# Patient Record
Sex: Female | Born: 1960 | Race: Black or African American | Hispanic: No | Marital: Married | Smoking: Never smoker
Health system: Southern US, Community
[De-identification: ages and names within clinical notes are randomized; demographics above are authoritative.]

## PROBLEM LIST (undated history)

## (undated) DIAGNOSIS — D57212 Sickle-cell/Hb-C disease with splenic sequestration: Secondary | ICD-10-CM

## (undated) DIAGNOSIS — H4310 Vitreous hemorrhage, unspecified eye: Secondary | ICD-10-CM

## (undated) DIAGNOSIS — D509 Iron deficiency anemia, unspecified: Secondary | ICD-10-CM

## (undated) DIAGNOSIS — E119 Type 2 diabetes mellitus without complications: Secondary | ICD-10-CM

## (undated) DIAGNOSIS — D696 Thrombocytopenia, unspecified: Secondary | ICD-10-CM

## (undated) DIAGNOSIS — N491 Inflammatory disorders of spermatic cord, tunica vaginalis and vas deferens: Secondary | ICD-10-CM

## (undated) DIAGNOSIS — I10 Essential (primary) hypertension: Secondary | ICD-10-CM

## (undated) DIAGNOSIS — D5720 Sickle-cell/Hb-C disease without crisis: Secondary | ICD-10-CM

## (undated) HISTORY — DX: Inflammatory disorders of spermatic cord, tunica vaginalis and vas deferens: N49.1

## (undated) HISTORY — DX: Iron deficiency anemia, unspecified: D50.9

## (undated) HISTORY — DX: Type 2 diabetes mellitus without complications: E11.9

## (undated) HISTORY — DX: Thrombocytopenia, unspecified: D69.6

## (undated) HISTORY — DX: Sickle-cell/Hb-C disease without crisis: D57.20

## (undated) HISTORY — DX: Sickle-cell/Hb-C disease with splenic sequestration: D57.212

## (undated) HISTORY — DX: Essential (primary) hypertension: I10

## (undated) HISTORY — DX: Vitreous hemorrhage, unspecified eye: H43.10

---

## 2011-07-06 ENCOUNTER — Telehealth: Payer: Self-pay | Admitting: Oncology

## 2011-07-06 ENCOUNTER — Telehealth: Payer: Self-pay | Admitting: *Deleted

## 2011-07-06 NOTE — Telephone Encounter (Signed)
called pts home and the number was not in service.  called george Larzelere lmomv that MD was avail 04/17and to rtn call to confirm appt

## 2011-07-06 NOTE — Telephone Encounter (Signed)
Rec'd referral from [facility] for new outpatient appt to be established with Dr Cyndie Chime for Sickle Cell with anemia. No records of any previous consult noted in EPIC or Echart. Called and left message for patient to call me back, maybe with a different name? Patient phone [phone], emergency contact sister, Indya Oliveria, [phone]. Proceed with orders to establish appt.

## 2011-07-06 NOTE — Telephone Encounter (Signed)
pt rtn call and is coming for appt on 04/17.  will fax a letter to [facility] with apt d/t

## 2011-07-07 ENCOUNTER — Telehealth: Payer: Self-pay | Admitting: Oncology

## 2011-07-07 NOTE — Telephone Encounter (Signed)
Referred by Dr. Gean Birchwood [place] Case Dx- Sickle Cell Dx

## 2011-07-15 ENCOUNTER — Encounter: Payer: Self-pay | Admitting: Oncology

## 2011-07-15 ENCOUNTER — Ambulatory Visit: Payer: Self-pay

## 2011-07-15 ENCOUNTER — Telehealth: Payer: Self-pay | Admitting: *Deleted

## 2011-07-15 ENCOUNTER — Ambulatory Visit: Payer: Self-pay | Admitting: Oncology

## 2011-07-15 ENCOUNTER — Other Ambulatory Visit: Payer: Self-pay

## 2011-07-15 NOTE — Progress Notes (Signed)
The patient failed to report for her new patient visit here today. My nurse called her and she didn't remember that she had an appointment here today despite a number of caused by our new appointment nurse. She will be rescheduled.

## 2011-07-15 NOTE — Telephone Encounter (Signed)
Pt did not show for appt this am.  Called ph # from Laroy Apple RN note & she was getting ready to go to work & will need to reschedule.  She knew about appt but didn't think about it being the 17th today.  Amy Clovis Riley RN notified to r/s.

## 2011-07-16 ENCOUNTER — Telehealth: Payer: Self-pay | Admitting: *Deleted

## 2011-07-16 NOTE — Telephone Encounter (Signed)
Patient was a no show for New Patient Appt on 07/15/11, notified Renaldo Harrison, [phone],  that patient did not show so they can continue to monitor labs if needed until patient is established with our practice. Orders sent to r/s appt to a later date.

## 2011-07-22 ENCOUNTER — Telehealth: Payer: Self-pay | Admitting: Oncology

## 2011-07-22 NOTE — Telephone Encounter (Signed)
Called pt and left message regarding appt , waiting for a call back

## 2011-07-23 ENCOUNTER — Telehealth: Payer: Self-pay | Admitting: Oncology

## 2011-07-23 NOTE — Telephone Encounter (Signed)
Talked to pt, she is aware of appt and location of the appt

## 2011-08-04 ENCOUNTER — Encounter: Payer: Self-pay | Admitting: Oncology

## 2011-08-04 ENCOUNTER — Other Ambulatory Visit: Payer: Self-pay | Admitting: Oncology

## 2011-08-04 DIAGNOSIS — H4310 Vitreous hemorrhage, unspecified eye: Secondary | ICD-10-CM

## 2011-08-04 DIAGNOSIS — D509 Iron deficiency anemia, unspecified: Secondary | ICD-10-CM

## 2011-08-04 DIAGNOSIS — D57212 Sickle-cell/Hb-C disease with splenic sequestration: Secondary | ICD-10-CM | POA: Insufficient documentation

## 2011-08-04 DIAGNOSIS — I10 Essential (primary) hypertension: Secondary | ICD-10-CM

## 2011-08-04 DIAGNOSIS — E119 Type 2 diabetes mellitus without complications: Secondary | ICD-10-CM | POA: Insufficient documentation

## 2011-08-04 DIAGNOSIS — D5720 Sickle-cell/Hb-C disease without crisis: Secondary | ICD-10-CM | POA: Insufficient documentation

## 2011-08-04 DIAGNOSIS — D696 Thrombocytopenia, unspecified: Secondary | ICD-10-CM

## 2011-08-04 HISTORY — DX: Type 2 diabetes mellitus without complications: E11.9

## 2011-08-04 HISTORY — DX: Essential (primary) hypertension: I10

## 2011-08-04 HISTORY — DX: Sickle-cell/Hb-C disease with splenic sequestration: D57.212

## 2011-08-04 HISTORY — DX: Thrombocytopenia, unspecified: D69.6

## 2011-08-04 HISTORY — DX: Iron deficiency anemia, unspecified: D50.9

## 2011-08-04 HISTORY — DX: Sickle-cell/Hb-C disease without crisis: D57.20

## 2011-08-04 HISTORY — DX: Vitreous hemorrhage, unspecified eye: H43.10

## 2011-08-05 ENCOUNTER — Telehealth: Payer: Self-pay | Admitting: Oncology

## 2011-08-05 ENCOUNTER — Ambulatory Visit: Payer: Managed Care, Other (non HMO)

## 2011-08-05 ENCOUNTER — Encounter: Payer: Self-pay | Admitting: Oncology

## 2011-08-05 ENCOUNTER — Ambulatory Visit (HOSPITAL_BASED_OUTPATIENT_CLINIC_OR_DEPARTMENT_OTHER): Payer: Managed Care, Other (non HMO) | Admitting: Oncology

## 2011-08-05 ENCOUNTER — Other Ambulatory Visit (HOSPITAL_BASED_OUTPATIENT_CLINIC_OR_DEPARTMENT_OTHER): Payer: Managed Care, Other (non HMO) | Admitting: Lab

## 2011-08-05 VITALS — BP 136/76 | HR 102 | Temp 97.1°F | Ht 64.0 in | Wt 193.7 lb

## 2011-08-05 DIAGNOSIS — D509 Iron deficiency anemia, unspecified: Secondary | ICD-10-CM

## 2011-08-05 DIAGNOSIS — I10 Essential (primary) hypertension: Secondary | ICD-10-CM

## 2011-08-05 DIAGNOSIS — D5720 Sickle-cell/Hb-C disease without crisis: Secondary | ICD-10-CM

## 2011-08-05 DIAGNOSIS — E119 Type 2 diabetes mellitus without complications: Secondary | ICD-10-CM

## 2011-08-05 DIAGNOSIS — R161 Splenomegaly, not elsewhere classified: Secondary | ICD-10-CM

## 2011-08-05 DIAGNOSIS — D57212 Sickle-cell/Hb-C disease with splenic sequestration: Secondary | ICD-10-CM

## 2011-08-05 DIAGNOSIS — D696 Thrombocytopenia, unspecified: Secondary | ICD-10-CM

## 2011-08-05 LAB — CBC & DIFF AND RETIC
BASO%: 0.7 % (ref 0.0–2.0)
Immature Retic Fract: 34.7 % — ABNORMAL HIGH (ref 1.60–10.00)
MCHC: 33.4 g/dL (ref 31.5–36.0)
MONO#: 0.4 10*3/uL (ref 0.1–0.9)
RBC: 3.97 10*6/uL (ref 3.70–5.45)
Retic %: 8.42 % — ABNORMAL HIGH (ref 0.70–2.10)
WBC: 5.7 10*3/uL (ref 3.9–10.3)
lymph#: 1.3 10*3/uL (ref 0.9–3.3)

## 2011-08-05 LAB — MORPHOLOGY

## 2011-08-05 LAB — CHCC SMEAR

## 2011-08-05 NOTE — Progress Notes (Signed)
Patient came in today as a new patient she has [employer].I did explain to her the co-pay assistance program we offer here for medication and chemo drugs.I also gave her my card just in case she has any question she can call me.

## 2011-08-05 NOTE — Progress Notes (Signed)
New Patient Hematology-Oncology Evaluation   Monique Dominguez [id number] [birth year] 51 y.o. 08/05/2011  CC: Dr. Chestine Spore; Dr. Earl Lagos [facility] in [place]; Dr. Doristine Section orthopedic surgery   Reason for referral: Resume management of this lady with hemoglobin [place] disease   HPI: 51 year old woman with known [place] disease who I cared for about 20 years ago. Old records are in a warehouse and not available at time of this dictation.  She has known splenomegaly consistent with [place] disease. Overall she has had an indolent course until more recently. She reminded me in the past when  she got in trouble I told her that she had a splenic sequestration syndrome. She moved to [facility] and did not have any hospitalizations for many years. She returned to [place] in 2007. In 2010 a few weeks after surgery for a detached left retina she developed back pain and shortness of breath and on further evaluation was found to be in crisis with a hemoglobin of 5 and admitted to [facility] where she required blood transfusions. She did well again until recently. She developed another episode of back pain in March of 2013. The pain was unrelated to activity and actually woke her up from sleep. She sought orthopedic surgical consultation with Dr. Doristine Section. MRI was done. There were some degenerative changes but overall she was told the study was inconclusive. She was given some pain medication and started on physical therapy. In April of this year she again developed episodic severe low back pain radiating down the back so both of her legs and then after a few weeks she developed progressive dyspnea. She was admitted to [facility] in [place] again on April 2. They were kind enough to send me records. She had a rapid fall in her hemoglobin from 9.5 down to 6.6 over a 24-hour interval. She received a blood transfusion. Other than a chest  radiograph which was normal except for atelectasis, no other diagnostic imaging was done. Reticulocyte count was 8.5%. Bilirubin 2.9. Platelet count as low as 54,000. Note states that a haptoglobin was low and LDH was high (values not recorded in the records that I received). A parvovirus level was checked -  result not available.  Old records were reviewed and confirmed hemoglobin [place] with hemoglobin A 32.4%, hemoglobin C. 36.3%, and hemoglobin S 38.3% done on 11/07/2009. Hospital course was uncomplicated and she was discharged on April 4. She believes her hemoglobin was 11 at discharge. Other than noted above, she has had no other interim medical problems. She is now established with Dr. Margaretmary Bayley for her primary care. She was told she is mildly hypothyroid and put on Synthroid which she stopped on her own. She is supposed to be taking a B complex vitamin but she tells me she rarely takes it. She has known hypertension and type 2 diabetes on oral agent. 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 Ashley Royalty. She denies any history of hepatitis, or HIV.  PMH: Past Medical History  Diagnosis Date  . 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 Benign essential HTN 08/04/2011  . DM type 2 (diabetes mellitus, type 2) 08/04/2011  . Thrombocytopenia 08/04/2011  . Iron deficiency anemia 08/04/2011  . 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 Vitreous hemorrhage 08/04/2011    Prior surgery: Cholecystectomy 1993.  Health maintenance: She has not yet had a colonoscopy. She is menopausal  Allergies: No Known Allergies; she gets itching with codeine preparations  Medications: Amaryl 4 mg twice a day; Cozaar 25 mg daily; January metastases and 50/500 one daily. She is encouraged to go back on her Synthroid and her B complex vitamins particularly folic acid.   Social History:  She is single. No children. 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 She has 2 dogs. She is a nonsmoker. No alcohol use.  Family History:  Review of Systems: Constitutional symptoms: Currently no constitutional symptoms HEENT: No sore throat Respiratory: No cough or dyspnea Cardiovascular:  No chest pain or palpitations Gastrointestinal ROS: No change in bowel habit Genito-Urinary ROS: No vaginal bleeding no urinary tract symptoms  Hematological and Lymphatic: Musculoskeletal: Episodic low back pain no other areas of pain Neurologic: No headache, no change in vision, no paresthesias Dermatologic: No rash Remaining ROS negative.  Physical Exam: Blood pressure 136/76, pulse 102, temperature 97.1 F (36.2 C), temperature source Oral, height 5\' 4"  (1.626 m), weight 193 lb 11.2 oz (87.862 kg). Wt Readings from Last 3 Encounters:  08/05/11 193 lb 11.2 oz (87.862 kg)    General appearance: Pleasant well-nourished African American woman Head: Normal Neck: Prominent thyroid gland but no nodules Lymph nodes: No adenopathy Breasts: Not examined Lungs: Clear to auscultation resonant to percussion Heart: Regular rhythm no murmur Abdominal: Soft obese nontender spleen measures 15 cm GU: Not examined Extremities: No edema no calf tenderness Neurologic: Mental status intact, cranial nerves intact, pupils equal round reactive to light, optic atrophy and abnormal fundus on the left due to previous retinal detachment. I was unable to get a clear look at the right fundus Skin: No rash or ecchymosis    Lab Results: Lab Results  Component Value Date   WBC 5.7 08/05/2011   HGB 10.0* 08/05/2011   HCT 29.9* 08/05/2011   MCV 75.3* 08/05/2011   PLT 80* 08/05/2011     Chemistry   No results found for this basename: NA, K, CL, CO2, BUN, CREATININE, GLU   No results found for this basename: CALCIUM, ALKPHOS, AST, ALT, BILITOT       Review of peripheral blood film: Pending    Impression and Plan: #1. Sickle [place] disease. Overall she has had indolent  disease. However I do think that her episodic back pain may actually be related to episodes of acute splenic infarction since they always result in accelerated hemolysis. I think we should get a CT scan of her abdomen the next time she has abdominal or back pain to look for splenic infarcts. In the past I advised her against splenectomy but if she keeps getting recurrent episodes of splenic sequestration/accelerated hemolysis, splenectomy may be good palliation.  #2. "Iron deficiency anemia" I doubt this very much! 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 People who are hemolyzing don't get iron deficiency anemia unless there are also bleeding. All of the iron from the hemolyzed red cells goes back to the bone marrow. This is a closed cycle in humans.  #3. Essential hypertension  #4. Type 2 diabetes on oral agents  #5. History of left retinal detachment  #6. 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 History of cholecystectomy      Levert Feinstein, MD 08/05/2011, 11:16 AM

## 2011-08-05 NOTE — Telephone Encounter (Signed)
gve the pt her sept 2013 appt calendar °

## 2011-08-06 LAB — IRON AND TIBC
%SAT: 30 % (ref 20–55)
TIBC: 305 ug/dL (ref 250–470)

## 2011-08-06 LAB — COMPREHENSIVE METABOLIC PANEL
ALT: 14 U/L (ref 0–35)
AST: 20 U/L (ref 0–37)
Alkaline Phosphatase: 57 U/L (ref 39–117)
Creatinine, Ser: 1.07 mg/dL (ref 0.50–1.10)
Total Bilirubin: 2.5 mg/dL — ABNORMAL HIGH (ref 0.3–1.2)

## 2011-08-06 LAB — HEPATITIS PANEL, ACUTE
Hep B C IgM: NEGATIVE
Hepatitis B Surface Ag: NEGATIVE

## 2011-08-27 ENCOUNTER — Telehealth: Payer: Self-pay

## 2011-08-27 NOTE — Telephone Encounter (Signed)
Message copied by Albertha Ghee on Thu Aug 27, 2011 12:31 PM ------      Message from: Levert Feinstein      Created: Wed Aug 26, 2011  7:53 PM       Call pt  - she tests negative for hepatitis A,B,C,HIV  Route cc lab to primary care

## 2011-08-27 NOTE — Telephone Encounter (Signed)
Attempted to contact pt with results.  Received recording stating "the person you are trying to reach is not accepting calls at this time." Will continue to attempt.   Labs routed to PCP. dph

## 2011-09-04 ENCOUNTER — Telehealth: Payer: Self-pay | Admitting: *Deleted

## 2011-09-04 NOTE — Telephone Encounter (Signed)
Message copied by Sabino Snipes on Fri Sep 04, 2011  5:50 PM ------      Message from: Levert Feinstein      Created: Wed Aug 26, 2011  7:53 PM       Call pt  - she tests negative for hepatitis A,B,C,HIV  Route cc lab to primary care

## 2011-09-04 NOTE — Telephone Encounter (Signed)
Message copied by Sabino Snipes on Fri Sep 04, 2011  6:27 PM ------      Message from: Levert Feinstein      Created: Wed Aug 26, 2011  7:53 PM       Call pt  - she tests negative for hepatitis A,B,C,HIV  Route cc lab to primary care

## 2011-09-04 NOTE — Telephone Encounter (Signed)
Reached pt's identified vm @ [phone] & left message per Dr Patsy Lager request.  Results will be routed to PCP.

## 2011-09-04 NOTE — Telephone Encounter (Signed)
Tried yest & today to reach pt & phone message says she is not accepting calls at this time.

## 2011-09-07 ENCOUNTER — Telehealth: Payer: Self-pay

## 2011-09-07 NOTE — Telephone Encounter (Signed)
Called and left pt message to call office back.  Per note left from previous desk RN, need to find out who pt's PCP is, so labs from 08/05/11 can be routed to him or her.

## 2011-09-08 ENCOUNTER — Telehealth: Payer: Self-pay | Admitting: *Deleted

## 2011-09-08 NOTE — Telephone Encounter (Signed)
Pt. Called & left message that PCP is Dr. Margaretmary Bayley.  08/05/11 lab results faxed to Dr. Chestine Spore.

## 2011-11-28 ENCOUNTER — Telehealth: Payer: Self-pay | Admitting: Oncology

## 2011-11-28 NOTE — Telephone Encounter (Signed)
Called pt and left message regarding appt that was moved from 9/13 to 9/16  due to MD call

## 2011-12-11 ENCOUNTER — Other Ambulatory Visit: Payer: Managed Care, Other (non HMO)

## 2011-12-11 ENCOUNTER — Ambulatory Visit: Payer: Managed Care, Other (non HMO) | Admitting: Oncology

## 2011-12-14 ENCOUNTER — Ambulatory Visit: Payer: Managed Care, Other (non HMO) | Admitting: Oncology

## 2011-12-16 ENCOUNTER — Encounter: Payer: Self-pay | Admitting: Oncology

## 2011-12-16 NOTE — Progress Notes (Signed)
Lady with sickle-C disease.  She failed to report for her visit today.

## 2013-10-16 ENCOUNTER — Encounter: Payer: Self-pay | Admitting: Oncology

## 2013-10-16 ENCOUNTER — Ambulatory Visit (INDEPENDENT_AMBULATORY_CARE_PROVIDER_SITE_OTHER): Payer: Managed Care, Other (non HMO) | Admitting: Oncology

## 2013-10-16 VITALS — BP 111/65 | HR 98 | Temp 97.7°F | Ht 64.0 in | Wt 177.5 lb

## 2013-10-16 DIAGNOSIS — M549 Dorsalgia, unspecified: Secondary | ICD-10-CM

## 2013-10-16 DIAGNOSIS — D57212 Sickle-cell/Hb-C disease with splenic sequestration: Secondary | ICD-10-CM

## 2013-10-16 DIAGNOSIS — D7389 Other diseases of spleen: Secondary | ICD-10-CM

## 2013-10-16 DIAGNOSIS — E119 Type 2 diabetes mellitus without complications: Secondary | ICD-10-CM

## 2013-10-16 DIAGNOSIS — D696 Thrombocytopenia, unspecified: Secondary | ICD-10-CM

## 2013-10-16 DIAGNOSIS — D5720 Sickle-cell/Hb-C disease without crisis: Secondary | ICD-10-CM

## 2013-10-16 DIAGNOSIS — I10 Essential (primary) hypertension: Secondary | ICD-10-CM

## 2013-10-16 LAB — COMPREHENSIVE METABOLIC PANEL
ALT: 19 U/L (ref 0–35)
AST: 20 U/L (ref 0–37)
Albumin: 4.7 g/dL (ref 3.5–5.2)
Alkaline Phosphatase: 71 U/L (ref 39–117)
BILIRUBIN TOTAL: 2.6 mg/dL — AB (ref 0.2–1.2)
BUN: 20 mg/dL (ref 6–23)
CO2: 28 meq/L (ref 19–32)
Calcium: 9.6 mg/dL (ref 8.4–10.5)
Chloride: 102 mEq/L (ref 96–112)
Creat: 1.13 mg/dL — ABNORMAL HIGH (ref 0.50–1.10)
GLUCOSE: 231 mg/dL — AB (ref 70–99)
Potassium: 4.8 mEq/L (ref 3.5–5.3)
Sodium: 143 mEq/L (ref 135–145)
Total Protein: 8.5 g/dL — ABNORMAL HIGH (ref 6.0–8.3)

## 2013-10-16 LAB — RETICULOCYTES
ABS RETIC: 229.4 10*3/uL — AB (ref 19.0–186.0)
RBC.: 4.76 MIL/uL (ref 3.87–5.11)
RETIC CT PCT: 4.82 % — AB (ref 0.4–2.3)

## 2013-10-16 LAB — CBC WITH DIFFERENTIAL/PLATELET
Basophils Absolute: 0.1 10*3/uL (ref 0.0–0.1)
Basophils Relative: 1 % (ref 0–1)
EOS ABS: 0.2 10*3/uL (ref 0.0–0.7)
Eosinophils Relative: 3 % (ref 0–5)
HEMATOCRIT: 34.2 % — AB (ref 36.0–46.0)
HEMOGLOBIN: 11.5 g/dL — AB (ref 12.0–15.0)
Lymphocytes Relative: 19 % (ref 12–46)
Lymphs Abs: 1.3 10*3/uL (ref 0.7–4.0)
MCH: 24.2 pg — AB (ref 26.0–34.0)
MCHC: 33.6 g/dL (ref 30.0–36.0)
MCV: 71.8 fL — AB (ref 78.0–100.0)
MONO ABS: 0.6 10*3/uL (ref 0.1–1.0)
MONOS PCT: 8 % (ref 3–12)
NEUTROS PCT: 69 % (ref 43–77)
Neutro Abs: 4.9 10*3/uL (ref 1.7–7.7)
Platelets: 88 10*3/uL — ABNORMAL LOW (ref 150–400)
RBC: 4.76 MIL/uL (ref 3.87–5.11)
RDW: 20.9 % — ABNORMAL HIGH (ref 11.5–15.5)
WBC: 7.1 10*3/uL (ref 4.0–10.5)

## 2013-10-16 LAB — LACTATE DEHYDROGENASE: LDH: 290 U/L — AB (ref 94–250)

## 2013-10-16 LAB — SAVE SMEAR

## 2013-10-16 NOTE — Progress Notes (Signed)
Patient ID: Monique Dominguez, female   DOB: 08/31/1960, 53 y.o.   MRN: [id number] Hematology and Oncology Follow Up Visit  Monique Dominguez [id number] 07/28/1960 53 y.o. 10/16/2013 10:46 AM   Principle Diagnosis: Encounter Diagnoses  Name Primary?  . Hemoglobin S-C disease, without crisis Yes  . Thrombocytopenia   . Sickle-cell/Hb-C disease with splenic sequestration      Interim History:   [name] with hemoglobin [place] disease. She has very indolent disease. Very rare crises. Chronic splenomegaly. I have not seen her in over 2 years. She had a severe crisis back in 2010 about 2 weeks following surgery for a retinal detachment presenting with back pain and dyspnea with findings of a hemoglobin of 5 g requiring hospitalization and transfusion at [facility]. She had another crisis in March of 2013 also presenting with back pain and  required hospitalization again at [facility] in April 2013.  Hemoglobin electrophoresis done 11/07/2009 confirmed [place] disease with hemoglobin a 32.4%, hemoglobin C. 36.3%, hemoglobin S. 38.3%. She has been stable over the last 2 years since most recent event with no further crises. She still gets intermittent left upper quadrant discomfort from her enlarged spleen.  She has had orthopedic consultation. She was started on a program of physical therapy with back strengthening exercises and this has helped significantly. Most of her pain is now localized in her hips. She was told that she needs hip replacement surgery but wants to defer this as long as possible. She was having some problems with unexplained diarrhea. Metformin which she takes for her type 2 diabetes was stopped but did not make any difference. She was started on some probiotics which has helped. She continues to work full-time at [employer]. She is bored with her job and is considering a change. She has no children. She has 2 dogs.   Medications:  reviewed  Allergies:  Allergies  Allergen Reactions  . Hydrocodone Itching    Review of Systems: Hematology:   ENT ROS:  Breast ROS:  Respiratory ROS:  Cardiovascular ROS:   Gastrointestinal ROS:    Genito-Urinary ROS:  Musculoskeletal ROS:  Neurological ROS:  Dermatological ROS:  Remaining ROS negative:   Physical Exam: Blood pressure 111/65, pulse 98, temperature 97.7 F (36.5 C), temperature source Oral, height 5\' 4"  (1.626 m), weight 177 lb 8 oz (80.513 kg), SpO2 98.00%. Wt Readings from Last 3 Encounters:  10/16/13 177 lb 8 oz (80.513 kg)  08/05/11 193 lb 11.2 oz (87.862 kg)     General appearance: Well-nourished African American woman HENNT: Pharynx no erythema, exudate, mass, or ulcer. No thyromegaly or thyroid nodules Lymph nodes: No cervical, supraclavicular, or axillary lymphadenopathy Breasts: Not examined today Lungs: Clear to auscultation, resonant to percussion throughout Heart: Regular rhythm, no murmur, no gallop, no rub, no click, no edema Abdomen: Soft, nontender, normal bowel sounds, no mass, spleen enlarged 12 cm below left costal margin no hepatomegaly Extremities: No edema, no calf tenderness Musculoskeletal: no joint deformities GU:  Vascular: Carotid pulses 2+, no bruits,  Neurologic: Alert, oriented, PERRLA,  cranial nerves grossly normal, motor strength 5 over 5, reflexes 1+ symmetric, upper body coordination normal, gait normal, Skin: No rash or ecchymosis  Lab Results: CBC W/Diff    Component Value Date/Time   WBC 5.7 08/05/2011 0915   RBC 3.97 08/05/2011 0915   HGB 10.0* 08/05/2011 0915   HCT 29.9* 08/05/2011 0915   PLT 80* 08/05/2011 0915   MCV 75.3*  08/05/2011 0915   MCH 25.2 08/05/2011 0915   MCHC 33.4 08/05/2011 0915   RDW 21.3* 08/05/2011 0915   LYMPHSABS 1.3 08/05/2011 0915   MONOABS 0.4 08/05/2011 0915   EOSABS 0.1 08/05/2011 0915   BASOSABS 0.0 08/05/2011 0915     Chemistry      Component Value Date/Time   NA 141 08/05/2011 0915   K 4.5  08/05/2011 0915   CL 104 08/05/2011 0915   CO2 28 08/05/2011 0915   BUN 15 08/05/2011 0915   CREATININE 1.07 08/05/2011 0915      Component Value Date/Time   CALCIUM 9.2 08/05/2011 0915   ALKPHOS 57 08/05/2011 0915   AST 20 08/05/2011 0915   ALT 14 08/05/2011 0915   BILITOT 2.5* 08/05/2011 0915    Today's lab pending    Impression:   #1. Sickle [place] disease.  Overall indolent disease. However I believe that her episodic back pain is  be related to episodes of acute splenic infarction since previous episode described resulted in accelerated hemolysis.  In the past I advised her against splenectomy but if she keeps getting recurrent episodes of splenic sequestration/accelerated hemolysis, splenectomy may be good palliation.   #2. "Iron deficiency anemia"  I doubt this very much! 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   #3. Essential hypertension   #4. Type 2 diabetes on oral agents   #5. History of left retinal detachment   #6. 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 History of cholecystectomy   CC: Dr. Pennie Banter, MD 7/20/201510:46 AM

## 2013-10-16 NOTE — Patient Instructions (Signed)
To lab today Visit with Dr Reece AgarG in 1 year  Lab 1 week before visit

## 2013-10-17 ENCOUNTER — Telehealth: Payer: Self-pay | Admitting: *Deleted

## 2013-10-17 NOTE — Telephone Encounter (Signed)
Message copied by Hassan BucklerPALMER, GLENDA H on Tue Oct 17, 2013  9:42 AM ------      Message from: Levert FeinsteinGRANFORTUNA, JAMES M      Created: Mon Oct 16, 2013  1:33 PM       Call pt lab stable at her baseline  Hemoglobin 11.5 plaelets 88,000 ------

## 2013-10-17 NOTE — Telephone Encounter (Signed)
Pt called /informed Hbg 11.5 and plt count 88,000; no questions.

## 2014-10-08 ENCOUNTER — Other Ambulatory Visit: Payer: Managed Care, Other (non HMO)

## 2014-10-15 ENCOUNTER — Ambulatory Visit: Payer: Managed Care, Other (non HMO) | Admitting: Oncology

## 2016-04-17 ENCOUNTER — Other Ambulatory Visit: Payer: Self-pay | Admitting: Obstetrics and Gynecology

## 2016-04-17 ENCOUNTER — Other Ambulatory Visit: Payer: Self-pay | Admitting: Oncology

## 2016-04-17 ENCOUNTER — Encounter (HOSPITAL_COMMUNITY): Payer: Self-pay | Admitting: *Deleted

## 2016-04-17 ENCOUNTER — Inpatient Hospital Stay (HOSPITAL_COMMUNITY)
Admission: AD | Admit: 2016-04-17 | Discharge: 2016-04-19 | DRG: 812 | Disposition: A | Discharge status: Home / Self Care | Payer: Self-pay | Attending: Internal Medicine | Admitting: Internal Medicine

## 2016-04-17 ENCOUNTER — Other Ambulatory Visit (INDEPENDENT_AMBULATORY_CARE_PROVIDER_SITE_OTHER): Payer: Self-pay

## 2016-04-17 ENCOUNTER — Inpatient Hospital Stay (HOSPITAL_COMMUNITY): Payer: Self-pay

## 2016-04-17 ENCOUNTER — Encounter: Payer: Self-pay | Admitting: Oncology

## 2016-04-17 ENCOUNTER — Ambulatory Visit (INDEPENDENT_AMBULATORY_CARE_PROVIDER_SITE_OTHER): Payer: Self-pay | Admitting: Oncology

## 2016-04-17 ENCOUNTER — Other Ambulatory Visit: Payer: Self-pay

## 2016-04-17 ENCOUNTER — Telehealth: Payer: Self-pay | Admitting: *Deleted

## 2016-04-17 VITALS — BP 129/59 | HR 98 | Temp 100.4°F | Wt 160.0 lb

## 2016-04-17 DIAGNOSIS — N491 Inflammatory disorders of spermatic cord, tunica vaginalis and vas deferens: Secondary | ICD-10-CM

## 2016-04-17 DIAGNOSIS — I10 Essential (primary) hypertension: Secondary | ICD-10-CM

## 2016-04-17 DIAGNOSIS — Z7984 Long term (current) use of oral hypoglycemic drugs: Secondary | ICD-10-CM

## 2016-04-17 DIAGNOSIS — Z885 Allergy status to narcotic agent status: Secondary | ICD-10-CM

## 2016-04-17 DIAGNOSIS — Z79899 Other long term (current) drug therapy: Secondary | ICD-10-CM

## 2016-04-17 DIAGNOSIS — Z8742 Personal history of other diseases of the female genital tract: Secondary | ICD-10-CM

## 2016-04-17 DIAGNOSIS — D57212 Sickle-cell/Hb-C disease with splenic sequestration: Secondary | ICD-10-CM

## 2016-04-17 DIAGNOSIS — R740 Nonspecific elevation of levels of transaminase and lactic acid dehydrogenase [LDH]: Secondary | ICD-10-CM | POA: Diagnosis present

## 2016-04-17 DIAGNOSIS — Z91012 Allergy to eggs: Secondary | ICD-10-CM

## 2016-04-17 DIAGNOSIS — T50995A Adverse effect of other drugs, medicaments and biological substances, initial encounter: Secondary | ICD-10-CM | POA: Diagnosis present

## 2016-04-17 DIAGNOSIS — D509 Iron deficiency anemia, unspecified: Secondary | ICD-10-CM

## 2016-04-17 DIAGNOSIS — Z872 Personal history of diseases of the skin and subcutaneous tissue: Secondary | ICD-10-CM

## 2016-04-17 DIAGNOSIS — E119 Type 2 diabetes mellitus without complications: Secondary | ICD-10-CM

## 2016-04-17 DIAGNOSIS — D7389 Other diseases of spleen: Secondary | ICD-10-CM | POA: Diagnosis present

## 2016-04-17 DIAGNOSIS — D696 Thrombocytopenia, unspecified: Secondary | ICD-10-CM

## 2016-04-17 DIAGNOSIS — R0600 Dyspnea, unspecified: Secondary | ICD-10-CM

## 2016-04-17 DIAGNOSIS — E1165 Type 2 diabetes mellitus with hyperglycemia: Secondary | ICD-10-CM

## 2016-04-17 DIAGNOSIS — R5081 Fever presenting with conditions classified elsewhere: Secondary | ICD-10-CM

## 2016-04-17 DIAGNOSIS — N764 Abscess of vulva: Secondary | ICD-10-CM | POA: Diagnosis present

## 2016-04-17 DIAGNOSIS — E860 Dehydration: Secondary | ICD-10-CM | POA: Diagnosis present

## 2016-04-17 DIAGNOSIS — N179 Acute kidney failure, unspecified: Secondary | ICD-10-CM | POA: Diagnosis present

## 2016-04-17 DIAGNOSIS — E871 Hypo-osmolality and hyponatremia: Secondary | ICD-10-CM | POA: Diagnosis present

## 2016-04-17 DIAGNOSIS — D5720 Sickle-cell/Hb-C disease without crisis: Secondary | ICD-10-CM

## 2016-04-17 DIAGNOSIS — Z833 Family history of diabetes mellitus: Secondary | ICD-10-CM

## 2016-04-17 HISTORY — DX: Inflammatory disorders of spermatic cord, tunica vaginalis and vas deferens: N49.1

## 2016-04-17 LAB — COMPREHENSIVE METABOLIC PANEL
ALT: 27 U/L (ref 14–54)
ANION GAP: 11 (ref 5–15)
AST: 43 U/L — ABNORMAL HIGH (ref 15–41)
Albumin: 3.6 g/dL (ref 3.5–5.0)
Alkaline Phosphatase: 111 U/L (ref 38–126)
BUN: 50 mg/dL — ABNORMAL HIGH (ref 6–20)
CALCIUM: 8.7 mg/dL — AB (ref 8.9–10.3)
CO2: 19 mmol/L — ABNORMAL LOW (ref 22–32)
Chloride: 93 mmol/L — ABNORMAL LOW (ref 101–111)
Creatinine, Ser: 2.54 mg/dL — ABNORMAL HIGH (ref 0.44–1.00)
GFR, EST AFRICAN AMERICAN: 23 mL/min — AB (ref >60)
GFR, EST NON AFRICAN AMERICAN: 20 mL/min — AB (ref >60)
Glucose, Bld: 568 mg/dL (ref 65–99)
POTASSIUM: 6.1 mmol/L — AB (ref 3.5–5.1)
Sodium: 123 mmol/L — ABNORMAL LOW (ref 135–145)
TOTAL PROTEIN: 8.2 g/dL — AB (ref 6.5–8.1)
Total Bilirubin: 2.3 mg/dL — ABNORMAL HIGH (ref 0.3–1.2)

## 2016-04-17 LAB — CBC WITH DIFFERENTIAL/PLATELET
BASOS ABS: 0 10*3/uL (ref 0.0–0.1)
Basophils Relative: 0 %
EOS ABS: 0 10*3/uL (ref 0.0–0.7)
Eosinophils Relative: 0 %
HCT: 22.4 % — ABNORMAL LOW (ref 36.0–46.0)
HEMOGLOBIN: 7.7 g/dL — AB (ref 12.0–15.0)
LYMPHS ABS: 0.9 10*3/uL (ref 0.7–4.0)
Lymphocytes Relative: 11 %
MCH: 23 pg — ABNORMAL LOW (ref 26.0–34.0)
MCHC: 34.4 g/dL (ref 30.0–36.0)
MCV: 66.9 fL — ABNORMAL LOW (ref 78.0–100.0)
MONO ABS: 0.7 10*3/uL (ref 0.1–1.0)
MONOS PCT: 8 %
Neutro Abs: 6.7 10*3/uL (ref 1.7–7.7)
Neutrophils Relative %: 81 %
PLATELETS: 94 10*3/uL — AB (ref 150–400)
RBC: 3.35 MIL/uL — AB (ref 3.87–5.11)
RDW: 22.6 % — AB (ref 11.5–15.5)
WBC: 8.3 10*3/uL (ref 4.0–10.5)

## 2016-04-17 LAB — URINALYSIS, ROUTINE W REFLEX MICROSCOPIC
Bacteria, UA: NONE SEEN
Bilirubin Urine: NEGATIVE
Glucose, UA: >=500 mg/dL — AB
KETONES UR: NEGATIVE mg/dL
LEUKOCYTES UA: NEGATIVE
Nitrite: NEGATIVE
PROTEIN: 30 mg/dL — AB
Specific Gravity, Urine: 1.01 (ref 1.005–1.030)
pH: 5 (ref 5.0–8.0)

## 2016-04-17 LAB — CBC
HCT: 20.3 % — ABNORMAL LOW (ref 36.0–46.0)
Hemoglobin: 6.9 g/dL — CL (ref 12.0–15.0)
MCH: 22.7 pg — AB (ref 26.0–34.0)
MCHC: 34 g/dL (ref 30.0–36.0)
MCV: 66.8 fL — AB (ref 78.0–100.0)
PLATELETS: 84 10*3/uL — AB (ref 150–400)
RBC: 3.04 MIL/uL — ABNORMAL LOW (ref 3.87–5.11)
RDW: 22.8 % — AB (ref 11.5–15.5)
WBC: 7.3 10*3/uL (ref 4.0–10.5)

## 2016-04-17 LAB — GLUCOSE, CAPILLARY
GLUCOSE-CAPILLARY: 536 mg/dL — AB (ref 65–99)
GLUCOSE-CAPILLARY: 524 mg/dL — AB (ref 65–99)

## 2016-04-17 LAB — RETICULOCYTES
RBC.: 3.35 MIL/uL — ABNORMAL LOW (ref 3.87–5.11)
RETIC CT PCT: 6.4 % — AB (ref 0.4–3.1)
Retic Count, Absolute: 214.4 10*3/uL — ABNORMAL HIGH (ref 19.0–186.0)

## 2016-04-17 LAB — GLUCOSE, RANDOM: GLUCOSE: 525 mg/dL — AB (ref 65–99)

## 2016-04-17 LAB — LACTATE DEHYDROGENASE: LDH: 684 U/L — AB (ref 98–192)

## 2016-04-17 LAB — FERRITIN: Ferritin: 707 ng/mL — ABNORMAL HIGH (ref 11–307)

## 2016-04-17 MED ORDER — INSULIN ASPART 100 UNIT/ML ~~LOC~~ SOLN
15.0000 [IU] | Freq: Once | SUBCUTANEOUS | Status: AC
  Administered 2016-04-17: 15 [IU] via SUBCUTANEOUS

## 2016-04-17 MED ORDER — ENSURE ENLIVE PO LIQD
237.0000 mL | Freq: Two times a day (BID) | ORAL | Status: DC
  Administered 2016-04-18: 237 mL via ORAL

## 2016-04-17 MED ORDER — SENNOSIDES-DOCUSATE SODIUM 8.6-50 MG PO TABS
1.0000 | ORAL_TABLET | Freq: Every day | ORAL | Status: DC
  Administered 2016-04-17 – 2016-04-18 (×2): 1 via ORAL
  Filled 2016-04-17 (×2): qty 1

## 2016-04-17 MED ORDER — TRAMADOL HCL 50 MG PO TABS
50.0000 mg | ORAL_TABLET | Freq: Two times a day (BID) | ORAL | Status: DC | PRN
  Administered 2016-04-17 – 2016-04-18 (×2): 50 mg via ORAL
  Filled 2016-04-17 (×2): qty 1

## 2016-04-17 MED ORDER — SODIUM CHLORIDE 0.9 % IV SOLN: INTRAVENOUS | Status: AC

## 2016-04-17 MED ORDER — ACETAMINOPHEN 325 MG PO TABS
650.0000 mg | ORAL_TABLET | Freq: Four times a day (QID) | ORAL | Status: DC | PRN
  Administered 2016-04-18: 650 mg via ORAL
  Filled 2016-04-17: qty 2

## 2016-04-17 MED ORDER — ACETAMINOPHEN 650 MG RE SUPP: 650.0000 mg | Freq: Four times a day (QID) | RECTAL | Status: DC | PRN

## 2016-04-17 MED ORDER — METFORMIN HCL ER 500 MG PO TB24: 750.0000 mg | ORAL_TABLET | Freq: Two times a day (BID) | ORAL | Status: DC

## 2016-04-17 MED ORDER — PROMETHAZINE HCL 25 MG PO TABS: 12.5000 mg | ORAL_TABLET | Freq: Four times a day (QID) | ORAL | Status: DC | PRN

## 2016-04-17 MED ORDER — SODIUM CHLORIDE 0.9% FLUSH
3.0000 mL | Freq: Two times a day (BID) | INTRAVENOUS | Status: DC
  Administered 2016-04-17 – 2016-04-19 (×3): 3 mL via INTRAVENOUS

## 2016-04-17 MED ORDER — LOSARTAN POTASSIUM-HCTZ 100-25 MG PO TABS: 1.0000 | ORAL_TABLET | Freq: Every day | ORAL | 11 refills | Status: DC

## 2016-04-17 MED ORDER — INSULIN ASPART 100 UNIT/ML ~~LOC~~ SOLN
0.0000 [IU] | SUBCUTANEOUS | Status: DC
  Administered 2016-04-18 (×2): 8 [IU] via SUBCUTANEOUS
  Administered 2016-04-18: 5 [IU] via SUBCUTANEOUS
  Administered 2016-04-18: 8 [IU] via SUBCUTANEOUS
  Administered 2016-04-18: 15 [IU] via SUBCUTANEOUS
  Administered 2016-04-19 (×2): 5 [IU] via SUBCUTANEOUS
  Administered 2016-04-19: 11 [IU] via SUBCUTANEOUS

## 2016-04-17 MED ORDER — SODIUM CHLORIDE 0.9 % IV SOLN
Freq: Once | INTRAVENOUS | Status: AC
  Administered 2016-04-18: 10 mL via INTRAVENOUS

## 2016-04-17 NOTE — Progress Notes (Signed)
Spoke to Dr Samuella CotaSvalina regarding patients cbg of 524. She said to administer 15 units. Lab glucose came back at 525 and I relayed this information to her.

## 2016-04-17 NOTE — Telephone Encounter (Signed)
Call from pt's sister, [facility] - requesting blood work to check pt's platelets. States pt believes plt are low - she does not have any energy. Denies any bleeding. Wants to come today. She has not been seen in office x 2 yrs.

## 2016-04-17 NOTE — Progress Notes (Signed)
[facility] [id number] Admission Data: 04/17/2016 6:54 PM Attending Provider: Inez CatalinaEmily B Mullen, MD  WUJ:WJXBJ,YNWGNFAPCP:CLARK,Monique S, MD Consults/ Treatment Team:   Monique CoupeSherry R Dominguez is a 56 y.o. female patient admitted from ED awake, alert  & orientated  X 3,  Full Code, VSS - Blood pressure 132/65, pulse 94, temperature 98.9 F (37.2 C), temperature source Oral, resp. rate 20, height 5\' 4"  (1.626 m), weight 71.8 kg (158 lb 3.2 oz)., no c/o shortness of breath, no c/o chest pain, no distress noted. Tele # 2 placed and pt is currently running:normal sinus rhythm.  Skin clean, dry and intact.   Allergies:   Allergies  Allergen Reactions  . Sulfa Antibiotics     Developed splenic sequestration crisis following TMP-SMX   . Eggs Or Egg-Derived Products Nausea And Vomiting    Can have egg mixed in just not alone  . Hydrocodone Itching     Past Medical History:  Diagnosis Date  . Abscess of tunica vaginalis 04/17/2016  . Benign essential HTN 08/04/2011  . DM type 2 (diabetes mellitus, type 2) (HCC) 08/04/2011  . Hemoglobin Dominguez-C disease (HCC) 08/04/2011  . Iron deficiency anemia 08/04/2011  . Sickle-cell/Hb-C disease with splenic sequestration (HCC) 08/04/2011  . Thrombocytopenia (HCC) 08/04/2011  . Vitreous hemorrhage (HCC) 08/04/2011    History:  obtained from the patient. Tobacco/alcohol: denied none  Pt orientation to unit, room and routine. Information packet given to patient/family and safety video watched.  Admission INP armband ID verified with patient/family, and in place. SR up x 2, fall risk assessment complete with Patient and family verbalizing understanding of risks associated with falls. Pt verbalizes an understanding of how to use the call bell and to call for help before getting out of bed.  Skin, clean-dry- intact without evidence of bruising, or skin tears.   No evidence of skin break down noted on exam. no rashes, no ecchymoses, no petechiae    Will cont to monitor and assist as  needed.  [facility] Monique Loselaine, RN 04/17/2016 6:54 PM

## 2016-04-17 NOTE — Progress Notes (Signed)
Hematology and Oncology Follow Up Visit  Monique Dominguez [id number] 08/23/1960 56 y.o. 04/17/2016 5:12 PM   Principle Diagnosis: Encounter Diagnoses  Name Primary?  . Type 2 diabetes mellitus without complication, without long-term current use of insulin (HCC) Yes  . Abscess of tunica vaginalis   . 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 Chronic thrombocytopenia in the 80,000 range due to the splenomegaly. Infrequent sickle crises. Hemoglobin 10-11 grams at baseline. Last documented crisis was in March 2013. She was hospitalized at [facility] at that time. Crisis previous to that was in 2010 about 2 weeks after surgery for a retinal detachment. She has not kept an appointment with me since 10/16/2013. Her sister called the office late this afternoon to report that the patient was feeling terrible and felt that her blood count was low and wanted to be seen. 2 weeks ago she developed what she was told was a vaginal abscess. It did not require incision or drainage. She went to a local urgent care center. She was put on Bactrim double strength trimethoprim sulfa 2 tablets twice daily. Over the next few days she developed progressive fatigue, anorexia, and stopped taking her  diabetes medications. She went to the emergency department in [facility] on January 15.  A 5 cm anogenital abscess in the area of the right mons pubis was described. It was indurated, erythematous, and painful. There was no fluctuance or discharge. No local adenopathy. Temperature was 99.6. Incision and drainage was done at that time. No fluid was aspirated. Recommendation was to continue Bactrim. Follow-up with GYN. She states that she did see a gynecologist this morning. She was told that the abscess was healing. Due to the intolerance to the  Septra, a course of cephalexin was prescribed which she has not yet started. Over the last week she has noted the indolent onset of dyspnea, and increasing pain in her left upper quadrant and left shoulder. She has had similar symptoms during prior crises. She has had mild intermittent headache. No vomiting. No diarrhea. No abdominal pain other than that related to her splenomegaly. Over the same interval, she has noted a decrease in her urine output. Urine is dark yellow. She denied brown urine.     Medications: reviewed  Allergies:  Allergies  Allergen Reactions  . Eggs Or Egg-Derived Products Nausea And Vomiting    Can have egg mixed in just not alone  . Hydrocodone Itching    Review of Systems: See interim history  No cough, no myalgias or arthralgias. Remaining ROS negative:   Physical Exam: Blood pressure 122/64, pulse (!) 101, temperature 100 F (37.8 C), temperature source Oral, weight 160 lb (72.6 kg), SpO2 100 %. Wt Readings from Last 3 Encounters:  04/17/16 160 lb (72.6 kg)  10/16/13 177 lb 8 oz (80.5 kg)  08/05/11 193 lb 11.2 oz (87.9 kg)     General appearance: Thin, acutely ill-appearing, African-American woman HENNT: Pharynx no erythema, exudate, mass, or ulcer. White coating on dorsum of tongue. No thyromegaly or thyroid nodules Lymph nodes: No cervical, supraclavicular, or axillary lymphadenopathy Breasts:  Lungs: Clear to auscultation, resonant to percussion throughout Heart: Regular rhythm, no murmur, no gallop, no rub, no click, no edema Abdomen: Soft, nontender, normal bowel sounds, tender, massive splenomegaly down past the umbilicus, tender right upper  quadrant without definite hepatomegaly.  Extremities: No edema, no calf tenderness Musculoskeletal: no joint deformities GU: Healing abscess right labia majora. Minimal tenderness. No fluctuance. No drainage. Vascular: Carotid pulses 2+, no bruits, Neurologic: Alert, oriented, PERRLA,  cranial nerves  grossly normal, motor strength 5 over 5, reflexes 1+ symmetric at the biceps, absent symmetric at the patellae., upper body coordination normal, gait normal, Skin: No rash or ecchymosis. Very dry skin.  Lab Results: CBC W/Diff    Component Value Date/Time   WBC 8.3 04/17/2016 1557   RBC 3.35 (L) 04/17/2016 1557   RBC 3.35 (L) 04/17/2016 1557   HGB 7.7 (L) 04/17/2016 1557   HGB 10.0 (L) 08/05/2011 0915   HCT 22.4 (L) 04/17/2016 1557   HCT 29.9 (L) 08/05/2011 0915   PLT PENDING 04/17/2016 1557   PLT 80 (L) 08/05/2011 0915   MCV 66.9 (L) 04/17/2016 1557   MCV 75.3 (L) 08/05/2011 0915   MCH 23.0 (L) 04/17/2016 1557   MCHC 34.4 04/17/2016 1557   RDW 22.6 (H) 04/17/2016 1557   RDW 21.3 (H) 08/05/2011 0915   LYMPHSABS PENDING 04/17/2016 1557   LYMPHSABS 1.3 08/05/2011 0915   MONOABS PENDING 04/17/2016 1557   MONOABS 0.4 08/05/2011 0915   EOSABS PENDING 04/17/2016 1557   EOSABS 0.1 08/05/2011 0915   BASOSABS PENDING 04/17/2016 1557   BASOSABS 0.0 08/05/2011 0915     Chemistry      Component Value Date/Time   NA 143 10/16/2013 0937   K 4.8 10/16/2013 0937   CL 102 10/16/2013 0937   CO2 28 10/16/2013 0937   BUN 20 10/16/2013 0937   CREATININE 1.13 (H) 10/16/2013 0937      Component Value Date/Time   CALCIUM 9.6 10/16/2013 0937   ALKPHOS 71 10/16/2013 0937   AST 20 10/16/2013 0937   ALT 19 10/16/2013 0937   BILITOT 2.6 (H) 10/16/2013 [id number]       Radiological Studies: No results found.  Impression:  #1. 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 Significant fall in hemoglobin from baseline. Increase in chronic left upper quadrant pain radiating to the shoulder. Reticulocyte count, LDH, pending. Likely precipitated by recent perineal infection. I would treat with hydration. As needed analgesics. Transfuse if signs of accelerated hemolysis with further fall in her hemoglobin below 7 g. As noted above, her baseline hemoglobin is 10-11 grams when she is not  in crisis. Folic acid 1 mg by mouth daily.  #2. Abscess right labia majora She has had approximately 3 weeks of antibiotics at this point. There does not appear to be an active infection on my exam. I doubt that she would benefit by giving additional antibiotics at this time.  #3. Acute deterioration in overall status concomitant with starting double dose double strength trimethoprim sulfa. I'm concerned that she may also have G6PD deficiency and the sulfa drug in addition to the acute infection is responsible for the crisis. We cannot check G6PD enzyme during an acute crisis but we can avoid sulfa and other oxidized and medications.  #4. Uncontrolled diabetes Fingerstick glucose 536. Venipuncture sample pending so we don't know yet whether she is ketotic or acidotic. Urinalysis pending.  #5. Essential hypertension Currently normotensive. I would hold her antihypertensives until she is more stable.  #6. Chronic iron deficiency anemia Current MCV 67 suggesting a component of iron deficiency to her current anemia. I am checking a ferritin but it may not be reliable in the setting of an acute crisis since it  is an acute phase reactant. However, if it is below 50, this suggests that there is a component of iron deficiency.  I reviewed all of the above in person with the internal medicine housestaff and the patient will be admitted to the hospital from our clinic today.   CC: Patient Care Team: Laurena Slimmer, MD as PCP - General (Internal Medicine)   Levert Feinstein, MD 1/19/20185:12 PM

## 2016-04-17 NOTE — Telephone Encounter (Signed)
Talked to pt's sister - Dr Cyndie ChimeGranfortuna, stated after looking at previous labs, may come Monday for labs. Sister stated they are already in the neighborhood, so prefers to go today and get results by Monday. Pt put on lab schedule.

## 2016-04-17 NOTE — Progress Notes (Signed)
CRITICAL VALUE ALERT  Hemoglobin Critical value received:  6.9   Date of notification:  04/17/16   Time of notification:  2320  Critical value read back:Yes.    Nurse who received alert:  Dorothe PeaKaren Cleatus Gabriel, RN  MD notified (1st page):  Internal Medicine   Time of first page:  2325   MD notified (2nd page):  Time of second page:  Responding MD:  Dr Samuella CotaSvalina  Time MD responded:  2330

## 2016-04-17 NOTE — H&P (Signed)
Date: 04/17/2016               Patient Name:  Monique Dominguez MRN: [id number]  DOB: 02/16/1961 Age / Sex: 56 y.o., female   PCP: Laurena Slimmer, MD         Medical Service: Internal Medicine Teaching Service         Attending Physician: Dr. Debe Coder    First Contact: Dr. Althia Forts Pager: [phone]  Second Contact: Dr. Deneise Lever Pager: [phone]       After Hours (After 5p/  First Contact Pager: [phone]  weekends / holidays): Second Contact Pager: [phone]   Chief Complaint: Toma Deiters, appetite loss, fatigue  History of Present Illness: Monique Dominguez is a 56 y.o. 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 Cyndie Chime reporting one week of malaise, loss of appetite, and fatigue. She has also noted the indolent onset of dyspnea, and increasing pain in her left upper quadrant and left shoulder. She has had similar symptoms during prior splenic crises. She has had mild intermittent headache. No vomiting. No diarrhea. No abdominal pain other than that related to her splenomegaly. Over the same interval, she has noted a decrease in her urine output. Urine is dark yellow, not brown. This past week she has felt so poorly that she stopped taking her regular medications for diabetes and HTN.  Preceding this illness, 2-3 weeks ago she developed a labial abscess. It did not require incision or drainage. She went to a local urgent care center. She was put on Bactrim double strength 2 tablets twice daily. Over the next few days she developed progressive fatigue, anorexia, and stopped taking her diabetes medications. She went to the emergency department in [facility] on January 15.  A 5 cm anogenital abscess in the area of the right mons pubis was described. It was indurated, erythematous, and painful. There was no fluctuance or discharge. No local  adenopathy. Temperature was 99.6. Incision and drainage was done at that time. No fluid was aspirated. Recommendation was to continue Bactrim. Follow-up with GYN. She states that she did see a gynecologist this morning. She was told that the abscess was healing. Due to the intolerance to the Septra, a course of cephalexin was prescribed which she has not yet started.  In the clinic she had a temp 100.4, HR to 101, RR 20, BP 122/64, SpO2 100% on RA with palpable splenomegaly down past the umbilicus, healing/benign-appearing right labial abscess. Initial labs remarkable for Hb 7.7, WBC 8.3, plt 94. Retic 6.4%, CBG 568. CMP remarkable for Na 123, K 6.1, CO2 19, BUN 50, Cr 2.54 (baseline 1.1 in 2015), bili 2.3. LDH 684, ferritin 707. EKG NSR, CXR nml. IMTS contacted for admission.   Meds:  Current Facility-Administered Medications for the 04/17/16 encounter [facility] Encounter)  Medication  . metFORMIN (GLUCOPHAGE-XR) 24 hr tablet 750 mg   Current Meds  Medication Sig  . Cholecalciferol (VITAMIN D3 PO) Take 1 tablet by mouth daily.  . metFORMIN (GLUCOPHAGE-XR) 750 MG 24 hr tablet Take 750 mg by mouth 2 (two) times daily.  Marland Kitchen POTASSIUM CHLORIDE PO Take 1 tablet by mouth daily. Takes over the counter potassium supplement  . Pyridoxine HCl (VITAMIN B-6 PO) Take 1 tablet by mouth daily.    Allergies: Allergies as of 04/17/2016 - Review Complete 04/17/2016  Allergen Reaction Noted  . Eggs  or egg-derived products Nausea And Vomiting 07/01/2011  . Hydrocodone Itching 10/16/2013   Past Medical History:  Diagnosis Date  . Abscess of tunica vaginalis 04/17/2016  . Benign essential HTN 08/04/2011  . DM type 2 (diabetes mellitus, type 2) (HCC) 08/04/2011  . Hemoglobin S-C disease (HCC) 08/04/2011  . Iron deficiency anemia 08/04/2011  . Sickle-cell/Hb-C disease with splenic sequestration (HCC) 08/04/2011  . Thrombocytopenia (HCC) 08/04/2011  . Vitreous hemorrhage (HCC) 08/04/2011    Family History:  No family  history on file.  Social History:  Social History   Social History  . Marital status: Married    Spouse name: N/A  . Number of children: N/A  . Years of education: N/A   Occupational History  . Not on file.   Social History Main Topics  . Smoking status: Never Smoker  . Smokeless tobacco: Never Used  . Alcohol use No  . Drug use: No  . Sexual activity: Not on file   Other Topics Concern  . Not on file   Social History Narrative  . No narrative on file    Review of Systems: A complete ROS was negative except as per HPI.   Physical Exam: There were no vitals taken for this visit.  General appearance: Thin ill-appearing African American woman, sitting on exam room chair, in mild distress, restless HENT: Normocephalic, atraumatic, moist mucous membranes, conjunctival pallor present Eyes: PERRL, EOM inact, icterus present Cardiovascular: Mild tachycardic rate and regular rhythm, soft systolic murmurs, no rubs, gallops Respiratory: Clear to auscultation bilaterally, normal work of breathing Abdomen: BS+, soft, mildly distended, palpable splenomegaly well into LLQ, mild TTP around left flank GU: Right labia majora with healing abscess, minimal tenderness, no drainage or fluctuance Extremities: Normal bulk and range of motion, no edema, 2+ peripheral pulses Skin: Warm, dry, intact Neuro: Alert and oriented, cranial nerves grossly intact Psych: Appropriate affect, clear speech, thoughts linear and goal-directed  Assessment & Plan by Problem:  Splenic sequestration crisis,  known [place] disease. Significant fall in hemoglobin from baseline 10-11. Increase in chronic left upper quadrant pain radiating to the shoulder. Retics and LDH elevated, Likely precipitated by recent perineal infection and prescribed antibiotics. Concern for G6PD deficiency, will have to check when stable after crisis. Hemolysis reflected in labs with elevated bilirubin and K.  - IVF 150 cc/hr - Check CBC at  midnight and tomorrow morning - Trend BMP - Transfuse threshold < 7.0 - Tylenol or Tramadol for pain - Folic acid 1 mg daily - Sulfa abx added to allergy/interolance med list  Right labial abscess, s/p 3 weeks antibiotics, benign on exam  - Hold antibiotics, monitor for any sign of return of localized infectious symptoms  Hyperglycemia, glucose 560+ with no anion gap or urinary ketones. Reports polydipsia and blurry vision, fatigue, clinically dehydrated. Due to medication noncomplaince this week. Likely cause of her hyponatremia. - IVF per above - SSI   AKI, Cr 2.54 from baseline 1.1, likely somewhat due to dehydration but BUN/Cr < 20:1 may be intrarenal component related to HbSC disease - IVF per above - Trend BMP  Essential hypertension, normotensive, hold home meds until stabilized  FEN/GI: HH CM diet, replete electrolytes as needed  DVT ppx: SCDs  Code status: Full  Dispo: Admit patient to Inpatient with expected length of stay greater than 2 midnights.  Signed: Althia FortsAdam Stormey Wilborn, MD 04/17/2016, 6:14 PM  Pager: [phone]

## 2016-04-18 ENCOUNTER — Encounter (HOSPITAL_COMMUNITY): Payer: Self-pay | Admitting: Internal Medicine

## 2016-04-18 DIAGNOSIS — R0789 Other chest pain: Secondary | ICD-10-CM

## 2016-04-18 LAB — CBC
HCT: 23.8 % — ABNORMAL LOW (ref 36.0–46.0)
HEMATOCRIT: 23.2 % — AB (ref 36.0–46.0)
Hemoglobin: 7.9 g/dL — ABNORMAL LOW (ref 12.0–15.0)
Hemoglobin: 8.1 g/dL — ABNORMAL LOW (ref 12.0–15.0)
MCH: 23.4 pg — ABNORMAL LOW (ref 26.0–34.0)
MCH: 23.5 pg — AB (ref 26.0–34.0)
MCHC: 34.1 g/dL (ref 30.0–36.0)
MCHC: 34 g/dL (ref 30.0–36.0)
MCV: 68.6 fL — AB (ref 78.0–100.0)
MCV: 69 fL — ABNORMAL LOW (ref 78.0–100.0)
Platelets: 72 10*3/uL — ABNORMAL LOW (ref 150–400)
Platelets: 80 10*3/uL — ABNORMAL LOW (ref 150–400)
RBC: 3.38 MIL/uL — ABNORMAL LOW (ref 3.87–5.11)
RBC: 3.45 MIL/uL — ABNORMAL LOW (ref 3.87–5.11)
RDW: 23.1 % — AB (ref 11.5–15.5)
RDW: 22.8 % — AB (ref 11.5–15.5)
WBC: 7.1 10*3/uL (ref 4.0–10.5)
WBC: 7.6 10*3/uL (ref 4.0–10.5)

## 2016-04-18 LAB — PREPARE RBC (CROSSMATCH)

## 2016-04-18 LAB — GLUCOSE, CAPILLARY
GLUCOSE-CAPILLARY: 397 mg/dL — AB (ref 65–99)
GLUCOSE-CAPILLARY: 239 mg/dL — AB (ref 65–99)
GLUCOSE-CAPILLARY: 243 mg/dL — AB (ref 65–99)
Glucose-Capillary: 263 mg/dL — ABNORMAL HIGH (ref 65–99)
Glucose-Capillary: 275 mg/dL — ABNORMAL HIGH (ref 65–99)
Glucose-Capillary: 278 mg/dL — ABNORMAL HIGH (ref 65–99)
Glucose-Capillary: 442 mg/dL — ABNORMAL HIGH (ref 65–99)

## 2016-04-18 LAB — COMPREHENSIVE METABOLIC PANEL
ALBUMIN: 3.2 g/dL — AB (ref 3.5–5.0)
ALT: 23 U/L (ref 14–54)
AST: 47 U/L — AB (ref 15–41)
Alkaline Phosphatase: 101 U/L (ref 38–126)
Anion gap: 10 (ref 5–15)
BILIRUBIN TOTAL: 2.3 mg/dL — AB (ref 0.3–1.2)
BUN: 41 mg/dL — AB (ref 6–20)
CO2: 16 mmol/L — ABNORMAL LOW (ref 22–32)
Calcium: 8.7 mg/dL — ABNORMAL LOW (ref 8.9–10.3)
Chloride: 103 mmol/L (ref 101–111)
Creatinine, Ser: 1.98 mg/dL — ABNORMAL HIGH (ref 0.44–1.00)
GFR calc Af Amer: 31 mL/min — ABNORMAL LOW (ref >60)
GFR calc non Af Amer: 27 mL/min — ABNORMAL LOW (ref >60)
GLUCOSE: 250 mg/dL — AB (ref 65–99)
POTASSIUM: 4.7 mmol/L (ref 3.5–5.1)
Sodium: 129 mmol/L — ABNORMAL LOW (ref 135–145)
TOTAL PROTEIN: 7.8 g/dL (ref 6.5–8.1)

## 2016-04-18 LAB — TROPONIN I

## 2016-04-18 LAB — PROTIME-INR
INR: 1.31
Prothrombin Time: 16.4 seconds — ABNORMAL HIGH (ref 11.4–15.2)

## 2016-04-18 LAB — HEMOGLOBIN A1C
Hgb A1c MFr Bld: 6.6 % — ABNORMAL HIGH (ref 4.8–5.6)
Mean Plasma Glucose: 143 mg/dL

## 2016-04-18 LAB — APTT: aPTT: 44 seconds — ABNORMAL HIGH (ref 24–36)

## 2016-04-18 MED ORDER — DIPHENHYDRAMINE HCL 25 MG PO CAPS
25.0000 mg | ORAL_CAPSULE | Freq: Three times a day (TID) | ORAL | Status: DC | PRN
  Administered 2016-04-19 (×2): 25 mg via ORAL
  Filled 2016-04-18 (×3): qty 1

## 2016-04-18 MED ORDER — SODIUM CHLORIDE 0.9 % IV SOLN
INTRAVENOUS | Status: AC
  Administered 2016-04-18 (×2): via INTRAVENOUS

## 2016-04-18 MED ORDER — TRAMADOL HCL 50 MG PO TABS
50.0000 mg | ORAL_TABLET | Freq: Four times a day (QID) | ORAL | Status: DC | PRN
  Administered 2016-04-18: 50 mg via ORAL
  Filled 2016-04-18 (×2): qty 1

## 2016-04-18 MED ORDER — GLUCERNA SHAKE PO LIQD
237.0000 mL | Freq: Three times a day (TID) | ORAL | Status: DC
  Administered 2016-04-18 – 2016-04-19 (×3): 237 mL via ORAL

## 2016-04-18 NOTE — Progress Notes (Signed)
Initial Nutrition Assessment  DOCUMENTATION CODES:  Not applicable  INTERVENTION:  Glucerna Shake po TID, each supplement provides 220 kcal and 10 grams of protein  Magic cup at lunches, each supplement provides 290 kcal and 9 grams of protein  Soups as sides at meals.   NUTRITION DIAGNOSIS:  Inadequate oral intake related to Acute illness and reported inability to tolerate solids as evidenced by per patient/family report.  GOAL:  Patient will meet greater than or equal to 90% of their needs  MONITOR:  PO intake, Supplement acceptance, Diet advancement, Labs  REASON FOR ASSESSMENT:  Malnutrition Screening Tool    ASSESSMENT:  56 y/o woman PMHx DM2, HTN, HBsc disease, chronic thrombocytopenia and splenic sequestration crisis. Presented to heme/onc clinic w/ 1 week malaise, poor appetite/fatigue, dyspnea, pain in upper left quadrant. Worked up for Splenic sequestration crisis and AKI  Pt reports that she has been mostly only consuming liquids recently. She says she has no appetite for solids. She believes this is due to her "spleen pushing up under my diaphragm". She has mostly been sipping diet sodas. She denies any n/v/c/d. She does not experience any adverse affects when eating solids, she just doesn't desire them at this time.   She did not follow a DM diet at home as shown by her admittance to drinking fruit juice often.   RD went over potential interventions. Due to preferences for fluids, she was agreeable to having a lower sodium soup as a side at her meals. She was ok with glucerna as well.   She says she has been losing weight for a while. She was indirect when asked if she thought she has lost wt due to this illness. There is a large gap in her wt history and unable to determine how recently she has lost weight.   RD asked about her BG. She was extremely vague in her responses. She will sometimes check them. She said recently they have been ~250 mg/dl. This is not  consistent with recent labs. Though her a1c was 6.6  NFPE: WDL  Medications: Ensure, Insulin, Senna Labs: BG>500, a1c on 1/19: 6.6, renal labs improved, hyponatremia improved, albumin:3.2   Recent Labs Lab 04/17/16 1557 04/17/16 2243 04/18/16 0928  NA 123*  --  129*  K 6.1*  --  4.7  CL 93*  --  103  CO2 19*  --  16*  BUN 50*  --  41*  CREATININE 2.54*  --  1.98*  CALCIUM 8.7*  --  8.7*  GLUCOSE 568* 525* 250*   Diet Order:  Diet heart healthy/carb modified Room service appropriate? Yes; Fluid consistency: Thin  Skin:  Reviewed, no issues Last BM:  1/18  Height:  Ht Readings from Last 1 Encounters:  04/17/16 5\' 4"  (1.626 m)   Weight:  Wt Readings from Last 1 Encounters:  04/18/16 158 lb 11.7 oz (72 kg)   Wt Readings from Last 10 Encounters:  04/18/16 158 lb 11.7 oz (72 kg)  04/17/16 160 lb (72.6 kg)  10/16/13 177 lb 8 oz (80.5 kg)  08/05/11 193 lb 11.2 oz (87.9 kg)   Ideal Body Weight:  54.54 kg  BMI:  Body mass index is 27.25 kg/m.  Estimated Nutritional Needs:  Kcal:  1650-1800 (23-25 kcal/kg bw) Protein:  65-75 g (1.2-1.4 g/kg bw) Fluid:  1.6-1.8 L fluid  EDUCATION NEEDS:  Education needs no appropriate at this time  Christophe LouisNathan Nguyen Todorov RD, LDN, CNSC Clinical Nutrition Pager: [phone] 04/18/2016 5:49 PM

## 2016-04-18 NOTE — Progress Notes (Signed)
Paged MD for glucose of 442.Dr Samuella CotaSvalina ordered no insulin at this time. She wants CBGs at 0200 and 0600.

## 2016-04-18 NOTE — Progress Notes (Signed)
Subjective: [name] continues to feel fatigued with poor appetite. Received 1 unit of pRBCs for Hb 6.9 overnight in addition to IV fluids. Reports persistent and worsened left shoulder/chest pain today. Discussed the prn medications she has available and she agreed to try them.   Objective: Vital signs in last 24 hours: Vitals:   04/18/16 0500 04/18/16 0519 04/18/16 0631 04/18/16 0744  BP:  (!) 128/58 131/61 (!) 124/59  Pulse:  (!) 106 100 91  Resp:  (!) 21 18   Temp:  100.2 F (37.9 C) 99.5 F (37.5 C) 98.8 F (37.1 C)  TempSrc:  Oral  Oral  SpO2:  96% 99% 98%  Weight: 158 lb 11.7 oz (72 kg)     Height:        Intake/Output Summary (Last 24 hours) at 04/18/16 1314 Last data filed at 04/18/16 0900  Gross per 24 hour  Intake              240 ml  Output                0 ml  Net              240 ml    Physical Exam General appearance: Thin tired-appearing African American woman, resting in bed, in no distress HENT: Normocephalic, moist mucous membranes, conjunctival pallor present Eyes: PERRL, EOM inact, icterus present Cardiovascular: Mild tachycardic rate and regular rhythm, soft systolic murmur, no rubs, gallops Respiratory/Chest: Clear to auscultation bilaterally, normal work of breathing, left shoulder and chest wall TTP Abdomen: BS+, soft, mildly distended, palpable splenomegaly well into LLQ, mild TTP around left flank Extremities: Normal bulk and range of motion, no edema, 2+ peripheral pulses Skin: Warm, dry, intact, normal skin turgor Neuro: Alert and oriented Psych: Appropriate affect  Labs / Imaging / Procedures: CBC Latest Ref Rng & Units 04/18/2016 04/17/2016 04/17/2016  WBC 4.0 - 10.5 K/uL 7.1 7.3 8.3  Hemoglobin 12.0 - 15.0 g/dL 7.9(L) 6.9(LL) 7.7(L)  Hematocrit 36.0 - 46.0 % 23.2(L) 20.3(L) 22.4(L)  Platelets 150 - 400 K/uL 72(L) 84(L) 94(L)   BMP Latest Ref Rng & Units 04/18/2016 04/17/2016 04/17/2016  Glucose 65 - 99 mg/dL 161(W250(H) 960(AV525(HH) 409(WJ568(HH)  BUN  6 - 20 mg/dL 19(J41(H) - 47(W50(H)  Creatinine 0.44 - 1.00 mg/dL 2.95(A1.98(H) - 2.13(Y2.54(H)  Sodium 135 - 145 mmol/L 129(L) - 123(L)  Potassium 3.5 - 5.1 mmol/L 4.7 - 6.1(H)  Chloride 101 - 111 mmol/L 103 - 93(L)  CO2 22 - 32 mmol/L 16(L) - 19(L)  Calcium 8.9 - 10.3 mg/dL 8.6(V8.7(L) - 8.7(L)   X-ray Chest Pa And Lateral  Result Date: 04/17/2016 CLINICAL DATA:  Shortness of breath and productive cough. EXAM: CHEST  2 VIEW COMPARISON:  None. FINDINGS: The cardiomediastinal silhouette is within normal limits. The lungs are hypoinflated with minimal atelectasis in the lung bases. No airspace consolidation, edema, pleural effusion, or pneumothorax is identified. Right upper quadrant abdominal surgical clips are noted. No acute osseous abnormality is seen. IMPRESSION: Low lung volumes without evidence of acute airspace disease. Electronically Signed   By: Sebastian AcheAllen  Grady M.D.   On: 04/17/2016 20:45    Assessment/Plan: Splenic sequestration crisis,  known [place] disease. Significant fall in hemoglobin to 7.7 from baseline 10-11. Increase in chronic left upper quadrant pain radiating to the shoulder. Retics and LDH elevated, Likely precipitated by recent perineal infection and prescribed antibiotics. Concern for G6PD deficiency, will have to check when stable after crisis. Hemolysis reflected in labs with elevated  bilirubin and K. Received 1 unit blood on 1/20 for Hb 6.9, recovered to 7.9, mild low-grade temp. - IVF 125 cc/hr for 12 hr - Check CBC this evening and tomorrow AM - Transfuse threshold < 7.0 - Tylenol or Tramadol Q6H for pain - Benadryl 25mg  Q8h for itching and pre-transfusion - Folic acid 1 mg daily - Sulfa abx added to allergy/interolance med list - PT/OT eval and treat - very fatigued  Chest pain, extends along left costal margin and over left shoulder, TTP, likely due to splenomegaly - EKG not concerning for ischemic changes - Check troponin today - <0.03  Right labial abscess, s/p 3 weeks antibiotics,  benign on exam  - Hold antibiotics, monitor for any sign of return of localized infectious symptoms  Hyperglycemia, glucose 560+ with no anion gap or urinary ketones. Reports polydipsia and blurry vision, fatigue, clinically dehydrated. Due to medication noncomplaince this week. Likely cause of her hyponatremia. - IVF per above - SSI-M  AKI, Cr 2.54 from baseline 1.1, likely somewhat due to dehydration but BUN/Cr < 20:1 may be intrarenal component related to HbSC disease - IVF per above - Trend BMP  Essential hypertension, normotensive, hold home meds until stabilized  Dispo: Anticipated discharge in approximately 2-3 day(s).   LOS: 1 day   Althia Forts, MD 04/18/2016, 1:14 PM Pager: [phone]

## 2016-04-18 NOTE — Progress Notes (Signed)
Started patient's transfusion (started late d/t blood bank having to work on it longer). Beginning temperature was 99.5. After 15 minutes, it went to 100.2. Call placed to internal medicine, and was ordered to give 650mg  tylenol. Patient is comfortable, denies pain, and is in no apparent distress.

## 2016-04-19 DIAGNOSIS — E118 Type 2 diabetes mellitus with unspecified complications: Secondary | ICD-10-CM

## 2016-04-19 DIAGNOSIS — Z9114 Patient's other noncompliance with medication regimen: Secondary | ICD-10-CM

## 2016-04-19 DIAGNOSIS — N764 Abscess of vulva: Secondary | ICD-10-CM

## 2016-04-19 DIAGNOSIS — E860 Dehydration: Secondary | ICD-10-CM

## 2016-04-19 DIAGNOSIS — B9689 Other specified bacterial agents as the cause of diseases classified elsewhere: Secondary | ICD-10-CM

## 2016-04-19 DIAGNOSIS — I10 Essential (primary) hypertension: Secondary | ICD-10-CM

## 2016-04-19 DIAGNOSIS — Z882 Allergy status to sulfonamides status: Secondary | ICD-10-CM

## 2016-04-19 DIAGNOSIS — D509 Iron deficiency anemia, unspecified: Secondary | ICD-10-CM

## 2016-04-19 DIAGNOSIS — D57212 Sickle-cell/Hb-C disease with splenic sequestration: Principal | ICD-10-CM

## 2016-04-19 LAB — GLUCOSE, CAPILLARY
GLUCOSE-CAPILLARY: 238 mg/dL — AB (ref 65–99)
GLUCOSE-CAPILLARY: 308 mg/dL — AB (ref 65–99)
Glucose-Capillary: 218 mg/dL — ABNORMAL HIGH (ref 65–99)
Glucose-Capillary: 319 mg/dL — ABNORMAL HIGH (ref 65–99)

## 2016-04-19 LAB — CBC
HCT: 23.7 % — ABNORMAL LOW (ref 36.0–46.0)
HEMATOCRIT: 22.1 % — AB (ref 36.0–46.0)
Hemoglobin: 7.5 g/dL — ABNORMAL LOW (ref 12.0–15.0)
Hemoglobin: 8 g/dL — ABNORMAL LOW (ref 12.0–15.0)
MCH: 23.4 pg — AB (ref 26.0–34.0)
MCH: 23.3 pg — ABNORMAL LOW (ref 26.0–34.0)
MCHC: 33.9 g/dL (ref 30.0–36.0)
MCHC: 33.8 g/dL (ref 30.0–36.0)
MCV: 68.8 fL — ABNORMAL LOW (ref 78.0–100.0)
MCV: 69.1 fL — ABNORMAL LOW (ref 78.0–100.0)
Platelets: 75 10*3/uL — ABNORMAL LOW (ref 150–400)
Platelets: 85 10*3/uL — ABNORMAL LOW (ref 150–400)
RBC: 3.21 MIL/uL — AB (ref 3.87–5.11)
RBC: 3.43 MIL/uL — ABNORMAL LOW (ref 3.87–5.11)
RDW: 23 % — ABNORMAL HIGH (ref 11.5–15.5)
RDW: 22.8 % — ABNORMAL HIGH (ref 11.5–15.5)
WBC: 5.8 10*3/uL (ref 4.0–10.5)
WBC: 6.2 10*3/uL (ref 4.0–10.5)

## 2016-04-19 LAB — BASIC METABOLIC PANEL
Anion gap: 8 (ref 5–15)
BUN: 29 mg/dL — AB (ref 6–20)
CO2: 18 mmol/L — AB (ref 22–32)
CREATININE: 1.42 mg/dL — AB (ref 0.44–1.00)
Calcium: 8.3 mg/dL — ABNORMAL LOW (ref 8.9–10.3)
Chloride: 105 mmol/L (ref 101–111)
GFR calc Af Amer: 47 mL/min — ABNORMAL LOW (ref >60)
GFR calc non Af Amer: 40 mL/min — ABNORMAL LOW (ref >60)
Glucose, Bld: 229 mg/dL — ABNORMAL HIGH (ref 65–99)
POTASSIUM: 4.7 mmol/L (ref 3.5–5.1)
SODIUM: 131 mmol/L — AB (ref 135–145)

## 2016-04-19 LAB — LACTATE DEHYDROGENASE: LDH: 684 U/L — ABNORMAL HIGH (ref 98–192)

## 2016-04-19 MED ORDER — LOSARTAN POTASSIUM-HCTZ 100-25 MG PO TABS: 0.5000 | ORAL_TABLET | Freq: Every day | ORAL | 11 refills | Status: AC

## 2016-04-19 MED ORDER — SODIUM CHLORIDE 0.9 % IV SOLN
INTRAVENOUS | Status: DC
  Administered 2016-04-19: 11:00:00 via INTRAVENOUS

## 2016-04-19 MED ORDER — VITAMIN B-6 50 MG PO TABS
50.0000 mg | ORAL_TABLET | Freq: Every day | ORAL | Status: DC
  Administered 2016-04-19: 50 mg via ORAL
  Filled 2016-04-19: qty 1

## 2016-04-19 MED ORDER — FOLIC ACID 1 MG PO TABS: 1.0000 mg | ORAL_TABLET | Freq: Every day | ORAL | 0 refills | Status: AC

## 2016-04-19 MED ORDER — FOLIC ACID 1 MG PO TABS
1.0000 mg | ORAL_TABLET | Freq: Every day | ORAL | Status: DC
  Administered 2016-04-19: 1 mg via ORAL
  Filled 2016-04-19: qty 1

## 2016-04-19 MED ORDER — INSULIN ASPART 100 UNIT/ML ~~LOC~~ SOLN
0.0000 [IU] | SUBCUTANEOUS | Status: DC
  Administered 2016-04-19: 15 [IU] via SUBCUTANEOUS

## 2016-04-19 NOTE — Progress Notes (Signed)
Hematology She is doing much better. Decreasing LUQ and left shoulder pain. Hemoglobin nadir 6.9 and 1 unit of blood given. Hb today 7.5. No increase in bilirubin from her baseline. LDH approximately doubled from 290 to 680. Retic 6.4% c/w 4.8% 2 years ago. Urine on admission was trace positive for hemoglobin. Renal function improving with hydration. Blood sugars improved with insulin. No anion gap. PE: Blood pressure 113/61, pulse 77, temperature 98.7 F (37.1 C), temperature source Oral, resp. rate 17, height 5\' 4"  (1.626 m), weight 157 lb 9.6 oz (71.5 kg), SpO2 100 %. Lungs clear Spleen massively enlarged, less tender, extremities no edema, no calf tenderness  Impression: #1.Limited hemolytic crisis in [facility] provoked by labial abscess and possibly adverse reaction to Sulfa antibiotic. #2. Uncontrolled diabetes due to initial infection then self discontinuation of meds #3. Dehydration #3. Essential HTN  Currently normotensive holding meds in face of dehydration  She is improving in all areas. Check LDH.  Discussed w medicine attending & resident

## 2016-04-19 NOTE — Discharge Instructions (Signed)
It was a pleasure taking care of you Please cut down your blood pressure medicine in half until following up with your doctor. Continue the folic acid  Please follow up with your PCP Please follow up with Dr Cyndie ChimeGranfortuna - clinic will call to make appointment

## 2016-04-19 NOTE — Progress Notes (Signed)
Nsg Discharge Note  Admit Date:  04/17/2016 Discharge date: 04/19/2016   Monique Dominguez to be D/C'd Home per MD order.  AVS completed.  Copy for chart, and copy for patient signed, and dated. Patient/caregiver able to verbalize understanding.  Discharge Medication: Allergies as of 04/19/2016      Reactions   Sulfa Antibiotics    Developed splenic sequestration crisis following TMP-SMX    Eggs Or Egg-derived Products Nausea And Vomiting   Can have egg mixed in just not alone   Hydrocodone Itching      Medication List    TAKE these medications   folic acid 1 MG tablet Commonly known as:  FOLVITE Take 1 tablet (1 mg total) by mouth daily. Start taking on:  04/20/2016   glimepiride 4 MG tablet Commonly known as:  AMARYL Take 4 mg by mouth 2 (two) times daily.   losartan-hydrochlorothiazide 100-25 MG tablet Commonly known as:  HYZAAR Take 0.5 tablets by mouth daily. What changed:  how much to take   metFORMIN 750 MG 24 hr tablet Commonly known as:  GLUCOPHAGE-XR Take 750 mg by mouth 2 (two) times daily.   POTASSIUM CHLORIDE PO Take 1 tablet by mouth daily. Takes over the counter potassium supplement   VITAMIN B-6 PO Take 1 tablet by mouth daily.   VITAMIN D3 PO Take 1 tablet by mouth daily.       Discharge Assessment: Vitals:   04/19/16 0604 04/19/16 1402  BP: 113/61 (!) 130/55  Pulse: 77 (!) 102  Resp: 17 (!) 24  Temp: 98.7 F (37.1 C) 100 F (37.8 C)   Skin clean, dry and intact without evidence of skin break down, no evidence of skin tears noted. IV catheter discontinued intact. Site without signs and symptoms of complications - no redness or edema noted at insertion site, patient denies c/o pain - only slight tenderness at site.  Dressing with slight pressure applied.  D/c Instructions-Education: Discharge instructions given to patient/family with verbalized understanding. D/c education completed with patient/family including follow up instructions,  medication list, d/c activities limitations if indicated, with other d/c instructions as indicated by MD - patient able to verbalize understanding, all questions fully answered. Patient instructed to return to ED, call 911, or call MD for any changes in condition.  Patient escorted via WC, and D/C home via private auto.  Monique FlamingVicki L Eyanna Mcgonagle, RN 04/19/2016 4:21 PM

## 2016-04-19 NOTE — Discharge Summary (Signed)
Name: Monique Dominguez MRN: [id number] DOB: 04/21/1960 56 y.o. PCP: Monique SlimmerPreston S Clark, MD  Date of Admission: 04/17/2016  6:12 PM Date of Discharge: 04/19/2016 Attending Physician: Monique CatalinaEmily B Kalab Camps, MD  Discharge Diagnosis: 1. 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 Hyperglycemia in the setting of DM2 3. AKI 4. Healing right labial abscess 5. Essential HTN 6. Iron deficiency anemia   Discharge Medications: Allergies as of 04/19/2016      Reactions   Sulfa Antibiotics    Developed splenic sequestration crisis following TMP-SMX    Eggs Or Egg-derived Products Nausea And Vomiting   Can have egg mixed in just not alone   Hydrocodone Itching      Medication List    TAKE these medications   folic acid 1 MG tablet Commonly known as:  FOLVITE Take 1 tablet (1 mg total) by mouth daily. Start taking on:  04/20/2016   glimepiride 4 MG tablet Commonly known as:  AMARYL Take 4 mg by mouth 2 (two) times daily.   losartan-hydrochlorothiazide 100-25 MG tablet Commonly known as:  HYZAAR Take 0.5 tablets by mouth daily. What changed:  how much to take   metFORMIN 750 MG 24 hr tablet Commonly known as:  GLUCOPHAGE-XR Take 750 mg by mouth 2 (two) times daily.   POTASSIUM CHLORIDE PO Take 1 tablet by mouth daily. Takes over the counter potassium supplement   VITAMIN B-6 PO Take 1 tablet by mouth daily.   VITAMIN D3 PO Take 1 tablet by mouth daily.       Disposition and follow-up:   [facility].  At the hospital follow up visit please address:  1.  Renal function: Please assess if Cr continued to return to baseline 2.  Hemoglobin: She was discharged with Hgb of 8 - is this stable  2.  Labs / imaging needed at time of follow-up: CBC, BMET  3.  Pending labs/ test needing follow-up: None  Follow-up Appointments: Follow-up Information    Monique SlimmerLARK,PRESTON S, MD. Schedule an appointment as soon as  possible for a visit in 1 week(s).   Specialty:  Internal Medicine Contact information: [address] [place] [address] [phone]        Levert FeinsteinJAMES M GRANFORTUNA, MD. Go to.   Specialty:  Oncology Why:  clinic will call to make appt Contact information: [address] [place] [address] [phone]           Hospital Course by problem list:    1. 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 was evaluated in the hospital after presenting to Dr. Patsy LagerGranfortuna's hematology clinic with a 1 week history of malaise, loss of appetite and fatigue.  She also noted spleen enlargement, LUQ pain and Lt shoulder pain.  Further symptoms included decreased urine output.  She had recently been treated for a labial abscess with Septra/Bactrim and noticed these worsening symptoms soon after.  Her Hgb on admission was 7.7 and this trended down to 6.9.  LDH was elevated to 680. Bilirubin was not above her baseline.  She received 1 unit of blood.  She received IVF with NS throughout her stay.  She was treated for pain and itching with tylenol, tramadol, and benadryl. She was also treated with folic acid.  After transfusion, her Hgb stabilized around 8 and she was deemed appropriate for discharge.  She was discharged on folic acid.  Of note, patient had a  temperature of 100.2 with blood transfusion.  This improved with tylenol.  2. Hyperglycemia in the setting of DM2:  She was found to be hyperglycemic, but without an anion gap or urinary ketones.  She was clinically dehydrated.  She was treated with IVF of NS during her stay, treatment for above acute issue, and with SSI.  She had decreased PO intake and decreased use of her anti-hyperglycemics over the week she was ill which was deemed the cause.  On discharge, she was started back on her home oral medications of glimepiride and metformin.  Hgb A1C was 6.6.    3. AKI: On admission, Cr was 2.54 up from an apparent baseline  around 1.1.  With IVF administration, which improved to 1.42.  She should have further evaluation of her renal function at follow up.    4. Healing right labial abscess: No further abx given, she was thought to be healing well. Sulfa antibiotics were placed on her allergy list.    5. Essential HTN:  BP well controlled during admission.  On admission, her BP medication of lisinopril-hctz was held given her renal function.  As her kidneys were improving on discharge, her medication was restarted.    6. Iron deficiency anemia: Related to above Hgb [place] disease and acute illness.  Her Hgb stabilized.  She will continue to follow up with hematology.    7. Fatigue: Thought related to anemia.  PT evaluated and did not identify any PT needs.    Discharge Vitals:   BP (!) 130/55 (BP Location: Right Arm)   Pulse (!) 102   Temp 100 F (37.8 C) (Oral)   Resp (!) 24   Ht 5\' 4"  (1.626 m)   Wt 157 lb 9.6 oz (71.5 kg)   SpO2 100%   BMI 27.05 kg/m   Pertinent Labs, Studies, and Procedures:  Hgb 8.0 on discharge LDH 684 PLT 85  Discharge Instructions: Discharge Instructions    Diet - low sodium heart healthy    Complete by:  As directed    Increase activity slowly    Complete by:  As directed       Signed: Debe Coder, MD 04/19/2016, 3:53 PM

## 2016-04-19 NOTE — Progress Notes (Signed)
Subjective:  No acute events overnight. Pt denies nausea, vomiting, diarrhea, chest pain or generalized abd pain. Left spleen area stil ttp. Denies any chest pain.   HgB 8.1--> 7.5 today  Objective: Vital signs in last 24 hours: Vitals:   04/18/16 0744 04/18/16 1405 04/18/16 2058 04/19/16 0604  BP: (!) 124/59 (!) 129/58 112/66 113/61  Pulse: 91 (!) 103 (!) 103 77  Resp:  18 18 17   Temp: 98.8 F (37.1 C) 99.3 F (37.4 C) 98.8 F (37.1 C) 98.7 F (37.1 C)  TempSrc: Oral  Oral Oral  SpO2: 98% 92% 100% 100%  Weight:    157 lb 9.6 oz (71.5 kg)  Height:        Intake/Output Summary (Last 24 hours) at 04/19/16 1010 Last data filed at 04/18/16 1855  Gross per 24 hour  Intake            862.5 ml  Output                0 ml  Net            862.5 ml    Physical Exam General appearance: Thin tired-appearing African American woman, resting in bed, in no distress Cardiovascular: RRR, no m/r/g Respiratory/Chest: Clear to auscultation bilaterally, normal work of breathing, Abdomen: BS+, soft, mildly distended, continuing palpable splenomegaly well into LLQ, mild TTP around left flank Extremities: Normal bulk and range of motion, no edema, Skin: Warm, dry, intact, normal skin turgor  Labs / Imaging / Procedures: CBC Latest Ref Rng & Units 04/19/2016 04/18/2016 04/18/2016  WBC 4.0 - 10.5 K/uL 5.8 7.6 7.1  Hemoglobin 12.0 - 15.0 g/dL 7.5(L) 8.1(L) 7.9(L)  Hematocrit 36.0 - 46.0 % 22.1(L) 23.8(L) 23.2(L)  Platelets 150 - 400 K/uL 75(L) 80(L) 72(L)   BMP Latest Ref Rng & Units 04/19/2016 04/18/2016 04/17/2016  Glucose 65 - 99 mg/dL 161(W229(H) 960(A250(H) 540(JW525(HH)  BUN 6 - 20 mg/dL 11(B29(H) 14(N41(H) -  Creatinine 0.44 - 1.00 mg/dL 8.29(F1.42(H) 6.21(H1.98(H) -  Sodium 135 - 145 mmol/L 131(L) 129(L) -  Potassium 3.5 - 5.1 mmol/L 4.7 4.7 -  Chloride 101 - 111 mmol/L 105 103 -  CO2 22 - 32 mmol/L 18(L) 16(L) -  Calcium 8.9 - 10.3 mg/dL 8.3(L) 8.7(L) -   No results found.  Assessment/Plan: Splenic sequestration  crisis,  known [place] disease. Significant fall in hemoglobin to 7.7 from baseline 10-11. Increase in chronic left upper quadrant pain radiating to the shoulder. Retics and LDH elevated, Likely precipitated by recent perineal infection and prescribed antibiotics. Concern for G6PD deficiency, will have to check when stable after crisis. Hemolysis reflected in labs with elevated bilirubin and K. Received 1 unit blood on 1/20 for Hb 6.9, recovered to 7.9,  And today it is 7.5. We will continue to monitor CBCs every 12 hours.   - IVF 125 cc/hr for another 12 hrs - Check CBC this evening and tomorrow AM - Transfuse threshold < 7.0 - Tylenol or Tramadol Q6H for pain - Benadryl 25mg  Q8h for itching and pre-transfusion - Folic acid 1 mg daily   T2DM:  A1c of 6.6. CBGs have been low 200s. - SSI-R changed   AKI, Cr improved to 1.42 from 2.54.  baseline 1.1, likely due to dehydration  - IVF  - Trend BMP  Essential hypertension, normotensive, hold home meds until stabilized  Weakness: likely due to anemia -awaiting PT OT recs   Dispo: Anticipated discharge in approximately 1 day(s).   LOS: 2 days  Monique Lever, MD 04/19/2016, 10:10 AM Pager: [phone]

## 2016-04-19 NOTE — Progress Notes (Signed)
PT Cancellation Note  Patient Details Name: Monique Dominguez MRN: [id number] DOB: 02/23/1961   Cancelled Treatment:    Reason Eval/Treat Not Completed: PT screened, no needs identified, will sign off.  RN reports that pt is about to d/c home and has no mobility deficits that would prevent her from going home safely.  She does not recommend PT eval.  PT to sign off.    Thanks,    Rollene Rotundaebecca B. Carvell Hoeffner, PT, DPT [phone]   04/19/2016, 4:21 PM

## 2016-04-20 LAB — TYPE AND SCREEN
Blood Product Expiration Date: 201802062359
Blood Product Expiration Date: 201802072359
ISSUE DATE / TIME: 201801200443
Unit Type and Rh: 5100
Unit Type and Rh: 5100

## 2016-05-04 ENCOUNTER — Encounter: Payer: Self-pay | Admitting: Oncology

## 2016-05-04 ENCOUNTER — Ambulatory Visit (INDEPENDENT_AMBULATORY_CARE_PROVIDER_SITE_OTHER): Payer: Self-pay | Admitting: Oncology

## 2016-05-04 VITALS — BP 137/59 | HR 96 | Temp 97.5°F | Ht 64.0 in | Wt 162.7 lb

## 2016-05-04 DIAGNOSIS — Z882 Allergy status to sulfonamides status: Secondary | ICD-10-CM

## 2016-05-04 DIAGNOSIS — E118 Type 2 diabetes mellitus with unspecified complications: Secondary | ICD-10-CM

## 2016-05-04 DIAGNOSIS — D57212 Sickle-cell/Hb-C disease with splenic sequestration: Secondary | ICD-10-CM

## 2016-05-04 DIAGNOSIS — E119 Type 2 diabetes mellitus without complications: Secondary | ICD-10-CM

## 2016-05-04 DIAGNOSIS — D696 Thrombocytopenia, unspecified: Secondary | ICD-10-CM

## 2016-05-04 DIAGNOSIS — Z91012 Allergy to eggs: Secondary | ICD-10-CM

## 2016-05-04 DIAGNOSIS — D5720 Sickle-cell/Hb-C disease without crisis: Secondary | ICD-10-CM

## 2016-05-04 DIAGNOSIS — Z885 Allergy status to narcotic agent status: Secondary | ICD-10-CM

## 2016-05-04 LAB — CBC WITH DIFFERENTIAL/PLATELET
Basophils Absolute: 0.1 10*3/uL (ref 0.0–0.1)
Basophils Relative: 1 %
EOS ABS: 0.1 10*3/uL (ref 0.0–0.7)
EOS PCT: 2 %
HCT: 24.3 % — ABNORMAL LOW (ref 36.0–46.0)
HEMOGLOBIN: 7.8 g/dL — AB (ref 12.0–15.0)
Lymphocytes Relative: 26 %
Lymphs Abs: 1.5 10*3/uL (ref 0.7–4.0)
MCH: 23.2 pg — AB (ref 26.0–34.0)
MCHC: 32.1 g/dL (ref 30.0–36.0)
MCV: 72.3 fL — AB (ref 78.0–100.0)
MONO ABS: 0.3 10*3/uL (ref 0.1–1.0)
Monocytes Relative: 6 %
NEUTROS ABS: 3.8 10*3/uL (ref 1.7–7.7)
Neutrophils Relative %: 65 %
PLATELETS: 79 10*3/uL — AB (ref 150–400)
RBC: 3.36 MIL/uL — ABNORMAL LOW (ref 3.87–5.11)
RDW: 23.1 % — ABNORMAL HIGH (ref 11.5–15.5)
WBC: 5.8 10*3/uL (ref 4.0–10.5)

## 2016-05-04 LAB — COMPREHENSIVE METABOLIC PANEL
ALK PHOS: 62 U/L (ref 38–126)
ALT: 11 U/L — AB (ref 14–54)
AST: 18 U/L (ref 15–41)
Albumin: 3.8 g/dL (ref 3.5–5.0)
Anion gap: 9 (ref 5–15)
BUN: 12 mg/dL (ref 6–20)
CALCIUM: 9.4 mg/dL (ref 8.9–10.3)
CHLORIDE: 104 mmol/L (ref 101–111)
CO2: 25 mmol/L (ref 22–32)
CREATININE: 1.05 mg/dL — AB (ref 0.44–1.00)
GFR calc Af Amer: >60 mL/min (ref >60)
GFR calc non Af Amer: 58 mL/min — ABNORMAL LOW (ref >60)
Glucose, Bld: 261 mg/dL — ABNORMAL HIGH (ref 65–99)
Potassium: 4.5 mmol/L (ref 3.5–5.1)
SODIUM: 138 mmol/L (ref 135–145)
Total Bilirubin: 2.7 mg/dL — ABNORMAL HIGH (ref 0.3–1.2)
Total Protein: 7.7 g/dL (ref 6.5–8.1)

## 2016-05-04 LAB — RETICULOCYTES
RBC.: 3.36 MIL/uL — ABNORMAL LOW (ref 3.87–5.11)
RETIC CT PCT: 9.1 % — AB (ref 0.4–3.1)
Retic Count, Absolute: 305.8 10*3/uL — ABNORMAL HIGH (ref 19.0–186.0)

## 2016-05-04 LAB — SAVE SMEAR

## 2016-05-04 LAB — LACTATE DEHYDROGENASE: LDH: 223 U/L — AB (ref 98–192)

## 2016-05-04 NOTE — Patient Instructions (Addendum)
Back to lab Return visit 4-6 months We will call with results of the red cell enzyme test (G6PD) & other labs

## 2016-05-04 NOTE — Progress Notes (Signed)
Hematology and Oncology Follow Up Visit  Monique Dominguez [id number] 1961-02-01 56 y.o. 05/04/2016 1:33 PM   Principle Diagnosis: Encounter Diagnoses  Name Primary?  . Sickle-cell/Hb-C disease with splenic sequestration (HCC) Yes  . Sickle cell-hemoglobin C disease without crisis (HCC)   . Type 2 diabetes mellitus without complication, without long-term current use of insulin (HCC)   . Thrombocytopenia (HCC)      Interim History:  First post hospital visit for this 56 year old woman with hemoglobin [place] disease. Chronic splenomegaly. Indolent disease with less than 1 crisis per year. I had not seen her since her last crisis to and after years ago when she presented to the general medicine clinic on Friday afternoon 04/19/2016. Please see my detailed clinic note for a complete summary. She had developed a right labial abscess and was put on double dose, double strength Septra. The medication control the infection but made her sick. She stopped eating and drinking. She stopped taking her diabetes medications. She presented with uncontrolled hyperglycemia, dehydration, and a splenic sequestration hemolytic crisis. Hemoglobin fell from her most recent baseline of 11.5 g recorded in July 2015 to post hydration value was 6.9 g. She has chronic thrombocytopenia related to the massive splenomegaly which was unchanged in the 80-90000 range. White count was normal. She was afebrile. Initial blood sugar 525. BUN 50. Creatinine 2.5 Bicarbonate 16. Anion gap 10. Potassium 4.7. LDH 684. Baseline to 90. Bilirubin 2.3 which is her previous baseline. Reticulocyte count 6.4 %. She was admitted to the hospital. She was treated with fluids, insulin, blood transfusion, and her condition stabilized rapidly. At discharge hemoglobin 8.0, BUN 29, creatinine 1.4, glucose 229. She has mild left abdominal discomfort which is back to her baseline level. Severe left upper quadrant and left shoulder pain have resolved. No fever  since discharge.  One concern that I had was that she might have a deficiency of G6PD and that her crisis was partially precipitated by the trimethoprim sulfa.  Medications: reviewed  Allergies:  Allergies  Allergen Reactions  . Sulfa Antibiotics     Developed splenic sequestration crisis following TMP-SMX   . Eggs Or Egg-Derived Products Nausea And Vomiting    Can have egg mixed in just not alone  . Hydrocodone Itching    Review of Systems: See interim history. She fills her labial infection is now almost completely healed. She denies any exudate. Remaining ROS negative:   Physical Exam: Blood pressure (!) 137/59, pulse 96, temperature 97.5 F (36.4 C), temperature source Oral, height 5\' 4"  (1.626 m), weight 162 lb 11.2 oz (73.8 kg), SpO2 100 %. Wt Readings from Last 3 Encounters:  05/04/16 162 lb 11.2 oz (73.8 kg)  04/19/16 157 lb 9.6 oz (71.5 kg)  04/17/16 160 lb (72.6 kg)     General appearance: Well-nourished African-American woman HENNT: Pharynx no erythema, exudate, mass, or ulcer. No thyromegaly or thyroid nodules Lymph nodes: No cervical, supraclavicular, or axillary lymphadenopathy Breasts:  Lungs: Clear to auscultation, resonant to percussion throughout Heart: Regular rhythm, no murmur, no gallop, no rub, no click, no edema Abdomen: Soft, nontender, normal bowel sounds, no mass, massive splenomegaly 16-17 centimeters below left costal margin of the umbilicus. Mildly tender on palpation Extremities: No edema, no calf tenderness Musculoskeletal: no joint deformities GU: Examined at time of hospital admission not repeated today. Attempts at incision and drainage in the emergency department 2 days prior to admission did not reveal any purulent drainage. Acute very much Vascular: Carotid pulses 2+, no bruits, Neurologic:  Alert, oriented, PERRLA,  cranial nerves grossly normal, motor strength 5 over 5, reflexes 1+ symmetric, upper body coordination normal, gait  normal, Skin: No rash or ecchymosis  Lab Results: CBC W/Diff    Component Value Date/Time   WBC 5.8 05/04/2016 1132   RBC 3.36 (L) 05/04/2016 1132   RBC 3.36 (L) 05/04/2016 1132   HGB 7.8 (L) 05/04/2016 1132   HGB 10.0 (L) 08/05/2011 0915   HCT 24.3 (L) 05/04/2016 1132   HCT 29.9 (L) 08/05/2011 0915   PLT 79 (L) 05/04/2016 1132   PLT 80 (L) 08/05/2011 0915   MCV 72.3 (L) 05/04/2016 1132   MCV 75.3 (L) 08/05/2011 0915   MCH 23.2 (L) 05/04/2016 1132   MCHC 32.1 05/04/2016 1132   RDW 23.1 (H) 05/04/2016 1132   RDW 21.3 (H) 08/05/2011 0915   LYMPHSABS 1.5 05/04/2016 1132   LYMPHSABS 1.3 08/05/2011 0915   MONOABS 0.3 05/04/2016 1132   MONOABS 0.4 08/05/2011 0915   EOSABS 0.1 05/04/2016 1132   EOSABS 0.1 08/05/2011 0915   BASOSABS 0.1 05/04/2016 1132   BASOSABS 0.0 08/05/2011 0915     Chemistry      Component Value Date/Time   NA 138 05/04/2016 1132   K 4.5 05/04/2016 1132   CL 104 05/04/2016 1132   CO2 25 05/04/2016 1132   BUN 12 05/04/2016 1132   CREATININE 1.05 (H) 05/04/2016 1132   CREATININE 1.13 (H) 10/16/2013 0937      Component Value Date/Time   CALCIUM 9.4 05/04/2016 1132   ALKPHOS 62 05/04/2016 1132   AST 18 05/04/2016 1132   ALT 11 (L) 05/04/2016 1132   BILITOT 2.7 (H) 05/04/2016 1132       Radiological Studies: X-ray Chest Pa And Lateral  Result Date: 04/17/2016 CLINICAL DATA:  Shortness of breath and productive cough. EXAM: CHEST  2 VIEW COMPARISON:  None. FINDINGS: The cardiomediastinal silhouette is within normal limits. The lungs are hypoinflated with minimal atelectasis in the lung bases. No airspace consolidation, edema, pleural effusion, or pneumothorax is identified. Right upper quadrant abdominal surgical clips are noted. No acute osseous abnormality is seen. IMPRESSION: Low lung volumes without evidence of acute airspace disease. Electronically Signed   By: Sebastian AcheAllen  Grady M.D.   On: 04/17/2016 20:45    Impression:  #1. Sickle C disease  #2.  Splenomegaly secondary to #1  #3. 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 Recent splenic sequestration hemolytic crisis related to infection and uncontrolled diabetes with possible medication reaction to double strength Septra. Hemoglobin today significantly improved up to 10 g.  #5. Rule out G6PD deficiency Assay sent today now that she is not in a crisis.  CC: Patient Care Team: Laurena SlimmerPreston S Clark, MD as PCP - General (Internal Medicine)   Levert FeinsteinJAMES M Breydon Senters, MD 2/5/20181:33 PM

## 2016-05-05 ENCOUNTER — Ambulatory Visit: Payer: Self-pay | Admitting: Oncology

## 2016-05-07 ENCOUNTER — Other Ambulatory Visit: Payer: Self-pay | Admitting: Oncology

## 2016-05-07 ENCOUNTER — Telehealth: Payer: Self-pay | Admitting: *Deleted

## 2016-05-07 DIAGNOSIS — D5720 Sickle-cell/Hb-C disease without crisis: Secondary | ICD-10-CM

## 2016-05-07 DIAGNOSIS — D7389 Other diseases of spleen: Secondary | ICD-10-CM

## 2016-05-07 LAB — FERRITIN: FERRITIN: 199 ng/mL — AB (ref 15–150)

## 2016-05-07 LAB — GLUCOSE 6 PHOSPHATE DEHYDROGENASE
G-6-PD, QUANT: 4.9 U/g{Hb} (ref 4.6–13.5)
HEMOGLOBIN: 8 g/dL — AB (ref 11.1–15.9)

## 2016-05-07 NOTE — Telephone Encounter (Signed)
Pt called / informed "Hb 7.8 - holding steady since she left the hospital. Go ahead and start the iron as we discussed. 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 She should list sulfa drugs as an allergy. We need to repeat test again in 1-2 months to confirm." per Dr Cyndie ChimeGranfortuna. Voiced understanding.  Stated she will start Folic acid as discussed; and @ next visit will talk about iron supplementation.

## 2016-05-07 NOTE — Telephone Encounter (Signed)
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 She should list sulfa drugs as an allergy.  We need to repeat test again in 1-2 months to confirm. I will put in order

## 2016-06-29 ENCOUNTER — Other Ambulatory Visit: Payer: Self-pay

## 2016-10-12 ENCOUNTER — Ambulatory Visit: Payer: Self-pay | Admitting: Oncology

## 2017-09-06 ENCOUNTER — Telehealth: Payer: Self-pay | Admitting: Oncology

## 2017-09-06 NOTE — Telephone Encounter (Signed)
[facility]ax[facility] [phone] on 09/06/17, Release ID: [id number]

## 2018-03-26 IMAGING — CR DG CHEST 2V
2 series · 2 of 2 positions shown · non-contrast
Comparison: None.

CLINICAL DATA: Shortness of breath and productive cough.

EXAM:
CHEST  2 VIEW

[chest pa]
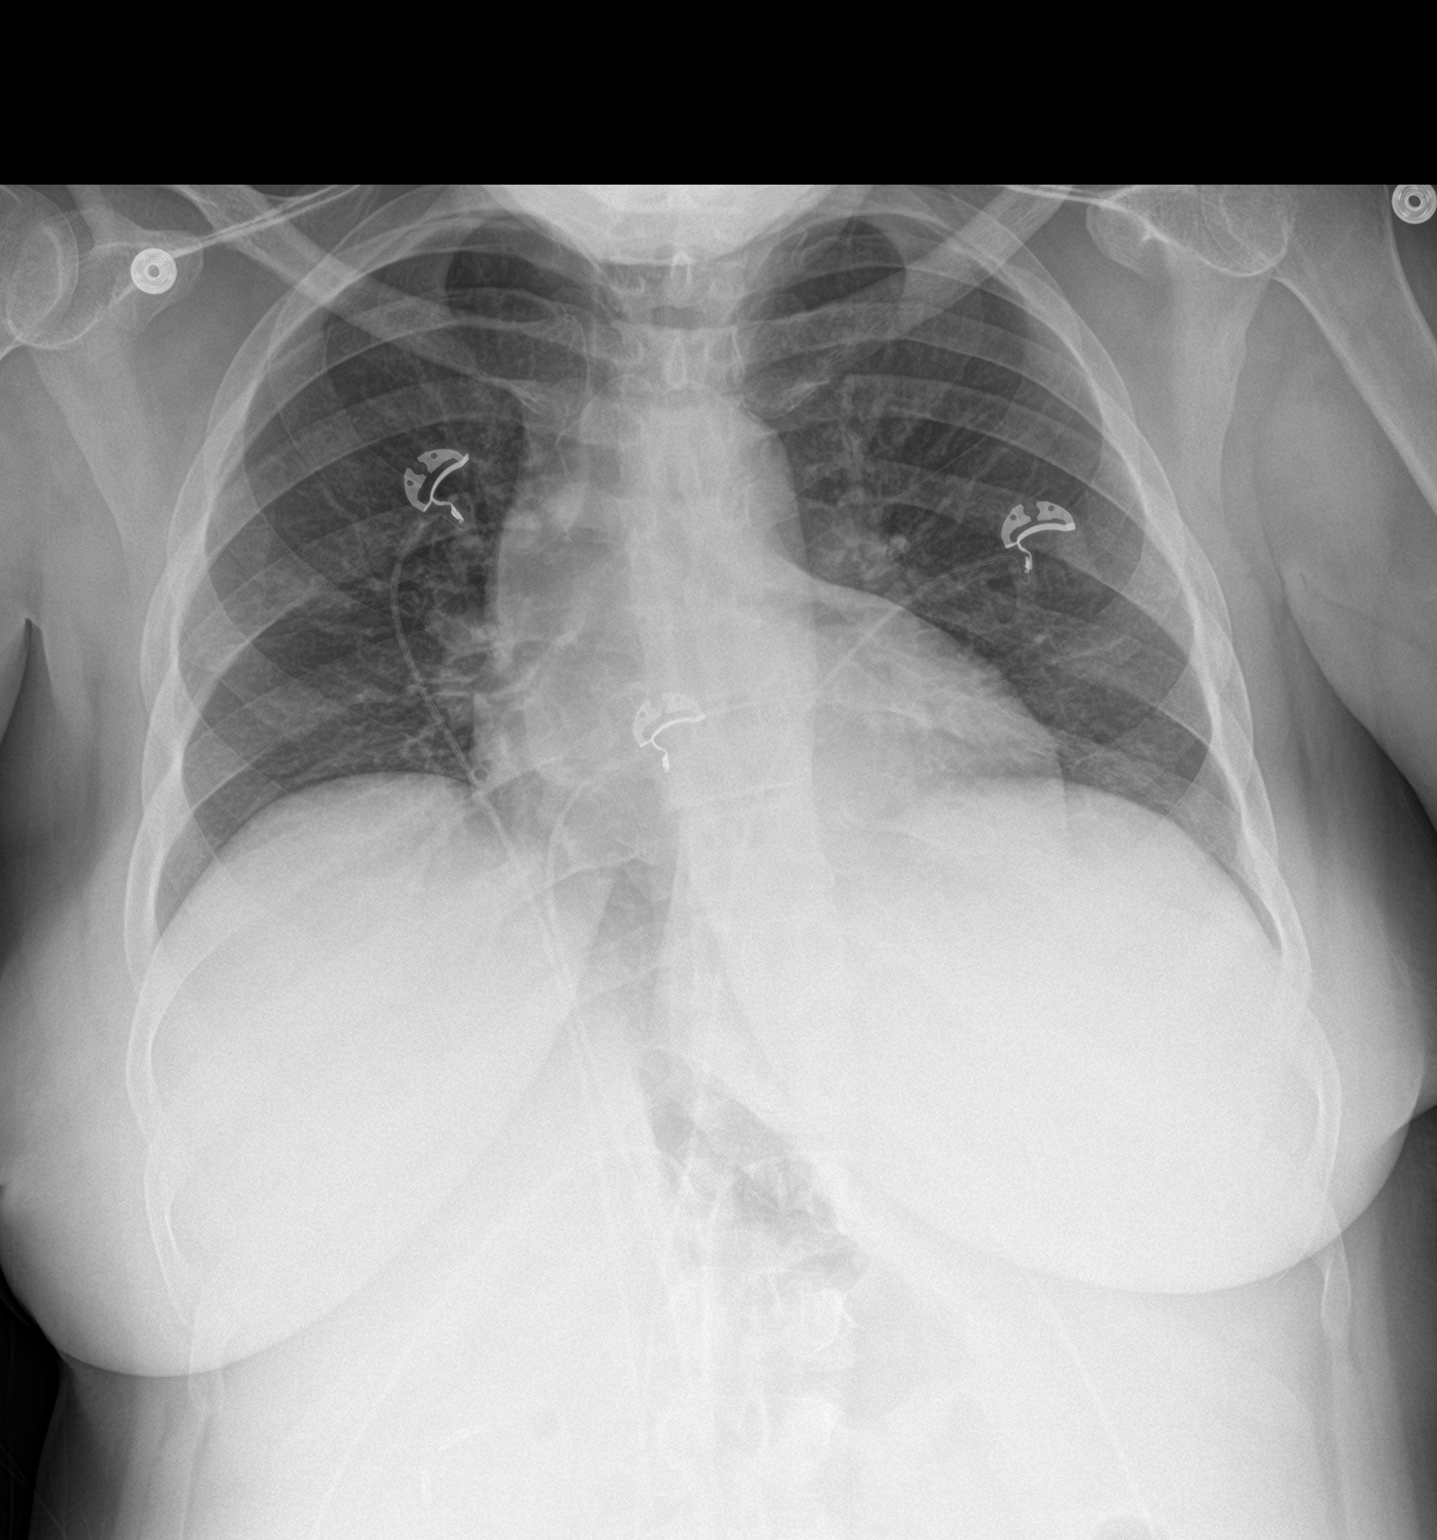

[chest lat]
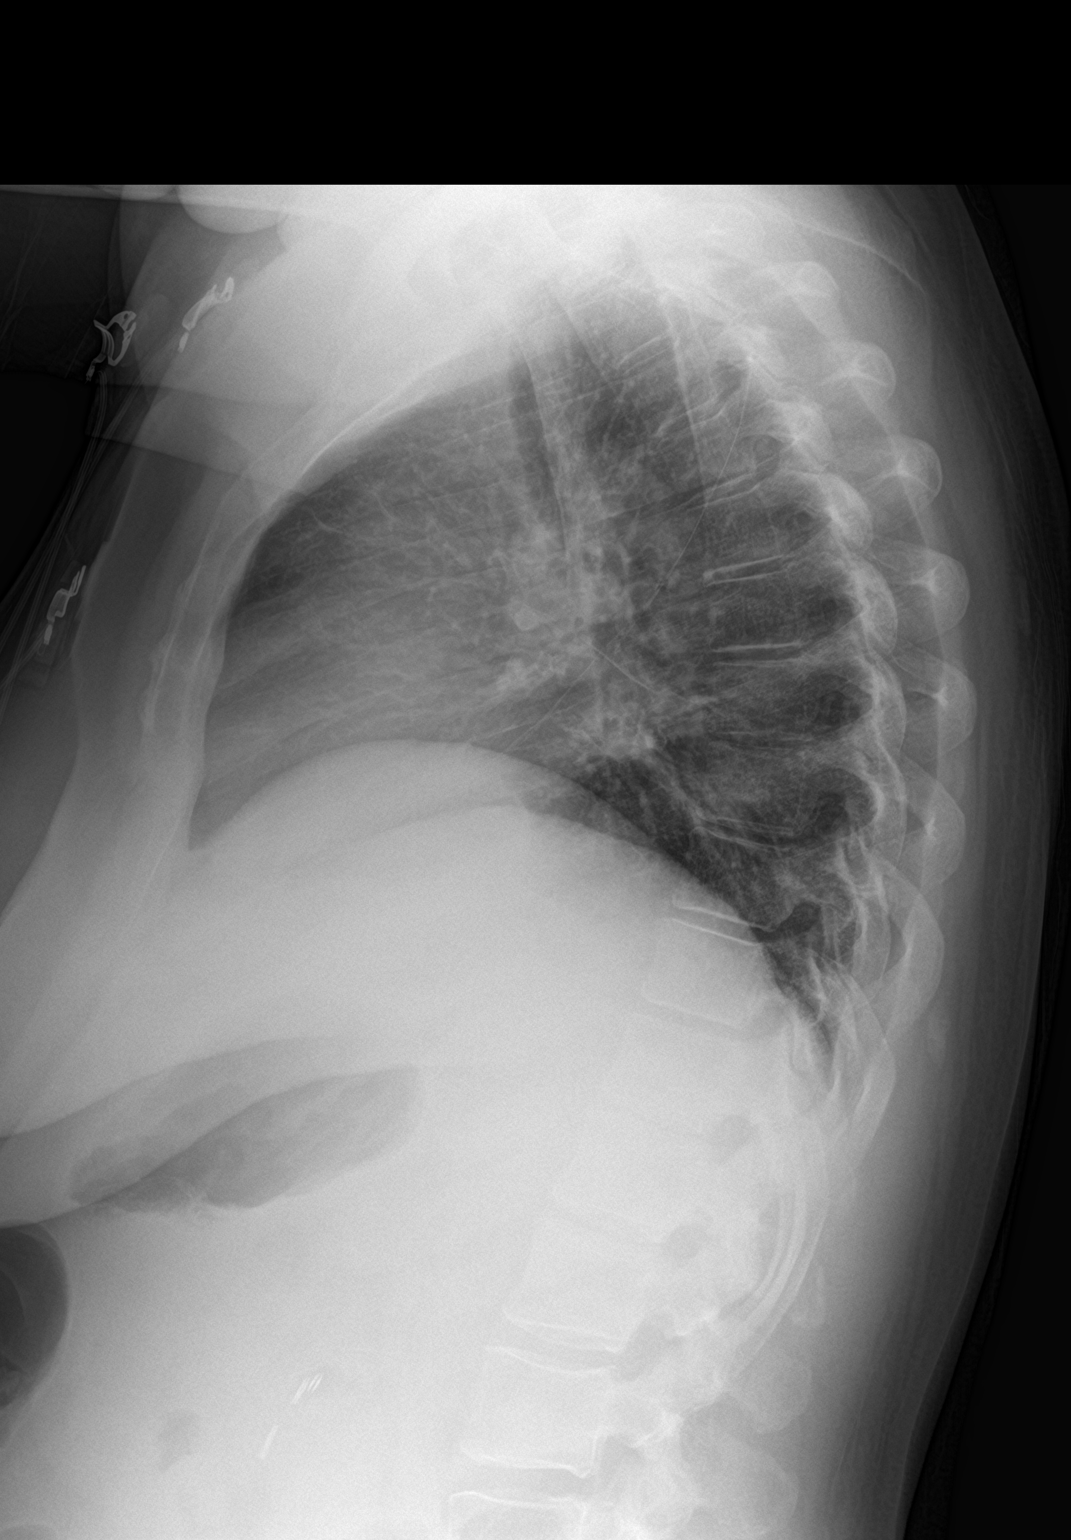

[2 of 2 positions shown; findings below may reference images not displayed]

FINDINGS: The cardiomediastinal silhouette is within normal limits. The lungs
are hypoinflated with minimal atelectasis in the lung bases. No
airspace consolidation, edema, pleural effusion, or pneumothorax is
identified. Right upper quadrant abdominal surgical clips are noted.
No acute osseous abnormality is seen.
IMPRESSION: Low lung volumes without evidence of acute airspace disease.
